# Patient Record
Sex: Male | Born: 1940
Health system: Southern US, Community
[De-identification: ages and names within clinical notes are randomized; demographics above are authoritative.]

## PROBLEM LIST (undated history)

## (undated) DIAGNOSIS — I499 Cardiac arrhythmia, unspecified: Secondary | ICD-10-CM

## (undated) DIAGNOSIS — I495 Sick sinus syndrome: Secondary | ICD-10-CM

## (undated) DIAGNOSIS — I2510 Atherosclerotic heart disease of native coronary artery without angina pectoris: Secondary | ICD-10-CM

## (undated) DIAGNOSIS — E7800 Pure hypercholesterolemia, unspecified: Secondary | ICD-10-CM

## (undated) DIAGNOSIS — I10 Essential (primary) hypertension: Secondary | ICD-10-CM

## (undated) DIAGNOSIS — G4730 Sleep apnea, unspecified: Secondary | ICD-10-CM

## (undated) DIAGNOSIS — I4891 Unspecified atrial fibrillation: Secondary | ICD-10-CM

## (undated) HISTORY — PX: ANAL FISSURE REPAIR: SHX2312

## (undated) HISTORY — PX: TONSILLECTOMY: SUR1361

## (undated) HISTORY — DX: Essential (primary) hypertension: I10

## (undated) HISTORY — DX: Unspecified atrial fibrillation: I48.91

## (undated) HISTORY — PX: CATARACT EXTRACTION: SUR2

## (undated) HISTORY — PX: AORTIC VALVE REPAIR: SHX6306

---

## 2004-06-06 ENCOUNTER — Ambulatory Visit: Payer: Self-pay

## 2005-06-07 ENCOUNTER — Ambulatory Visit: Payer: Self-pay | Admitting: Neurology

## 2007-06-15 ENCOUNTER — Ambulatory Visit: Payer: Self-pay | Admitting: Gastroenterology

## 2008-07-10 ENCOUNTER — Ambulatory Visit (HOSPITAL_BASED_OUTPATIENT_CLINIC_OR_DEPARTMENT_OTHER): Admission: RE | Admit: 2008-07-10 | Discharge: 2008-07-10 | Payer: Self-pay | Admitting: General Surgery

## 2008-07-10 ENCOUNTER — Encounter (INDEPENDENT_AMBULATORY_CARE_PROVIDER_SITE_OTHER): Payer: Self-pay | Admitting: General Surgery

## 2009-02-09 ENCOUNTER — Ambulatory Visit: Payer: Self-pay | Admitting: Internal Medicine

## 2009-06-26 ENCOUNTER — Ambulatory Visit: Payer: Self-pay | Admitting: Family Medicine

## 2009-07-22 ENCOUNTER — Ambulatory Visit: Payer: Self-pay | Admitting: Family Medicine

## 2009-08-22 ENCOUNTER — Ambulatory Visit: Payer: Self-pay | Admitting: Family Medicine

## 2010-05-28 ENCOUNTER — Ambulatory Visit: Payer: Self-pay | Admitting: Gastroenterology

## 2010-05-28 LAB — HM COLONOSCOPY

## 2010-06-03 LAB — PATHOLOGY REPORT

## 2010-07-03 LAB — CBC
HCT: 44.2 % (ref 39.0–52.0)
Hemoglobin: 15.2 g/dL (ref 13.0–17.0)
RDW: 12.8 % (ref 11.5–15.5)
WBC: 4.9 10*3/uL (ref 4.0–10.5)

## 2010-07-03 LAB — URINALYSIS, ROUTINE W REFLEX MICROSCOPIC
Glucose, UA: NEGATIVE mg/dL
Ketones, ur: NEGATIVE mg/dL
Nitrite: NEGATIVE
Specific Gravity, Urine: 1.016 (ref 1.005–1.030)
pH: 6 (ref 5.0–8.0)

## 2010-07-03 LAB — COMPREHENSIVE METABOLIC PANEL
ALT: 53 U/L (ref 0–53)
BUN: 12 mg/dL (ref 6–23)
CO2: 30 mEq/L (ref 19–32)
Calcium: 9.7 mg/dL (ref 8.4–10.5)
Creatinine, Ser: 0.89 mg/dL (ref 0.4–1.5)
GFR calc non Af Amer: >60 mL/min (ref >60)
Glucose, Bld: 94 mg/dL (ref 70–99)
Sodium: 140 mEq/L (ref 135–145)
Total Protein: 7 g/dL (ref 6.0–8.3)

## 2010-07-03 LAB — DIFFERENTIAL
Basophils Absolute: 0 10*3/uL (ref 0.0–0.1)
Lymphocytes Relative: 29 % (ref 12–46)
Lymphs Abs: 1.4 10*3/uL (ref 0.7–4.0)
Monocytes Absolute: 0.5 10*3/uL (ref 0.1–1.0)
Neutro Abs: 2.8 10*3/uL (ref 1.7–7.7)

## 2010-07-03 LAB — POCT HEMOGLOBIN-HEMACUE: Hemoglobin: 16.2 g/dL (ref 13.0–17.0)

## 2010-08-06 NOTE — Op Note (Signed)
NAME:  Jeremy Kane, Jeremy Kane              ACCOUNT NO.:  [id number]   MEDICAL RECORD NO.:  [id number]          PATIENT TYPE:  AMB   LOCATION:  DSC                          FACILITY:  [facility]   PHYSICIAN:  Angelia Mould. Derrell Lolling, M.D.DATE OF BIRTH:  1940/04/20   DATE OF PROCEDURE:  07/10/2008  DATE OF DISCHARGE:                               OPERATIVE REPORT   PREOPERATIVE DIAGNOSIS:  Chronic anal fissure.   POSTOPERATIVE DIAGNOSES:  Chronic anal fissure, distal rectal polyp, and  external hemorrhoidal skin tag.   OPERATION PERFORMED:  1. Rigid proctoscopy with removal of distal rectal polyp, right      lateral.  2. Lateral internal sphincterotomy with anal dilatation.  3. Removal of anal papilla, posterior midline.  4. Removal of external hemorrhoidal skin tag, left lateral.   SURGEON:  Angelia Mould. Derrell Lolling, MD   OPERATIVE INDICATIONS:  This is a 70 year old gentleman who has had  intermittent problems with rectal pain and low-volume rectal bleeding.  This has been getting worse lately.  It is just particularly severe  during and after a bowel movement and it hurts to sit and drive a car.  Hydrocortisone cream has been tried, but it has not been helpful.  He  had a colonoscopy in 2009 without any neoplasia being found.  He just  had polyps.  I initially evaluated him on May 22, 2008, at which time  he had very, very tiny external hemorrhoids and a sentinel pile in the  anterior midline which was not particularly tender, but I was unable to  do a digital rectal exam because of the anal sphincter spasm.  My  presumptive diagnosis was anal fissure and he was treated with Cardizem  cream and steroid cream and warm tub baths and fiber.  He returned 4  weeks later and said that it helps some, but he still has daily pain,  especially sharp pain during a bowel movement.  Again, I could not do an  internal exam in the office.  He was offered examination under  anesthesia and repair of his presumptive  anal fissure.  He was counseled  and it was very much in favor of having this done.   OPERATIVE FINDINGS:  The patient had somewhat tight anal sphincter.  He  had some small external hemorrhoidal skin tags.  He had what looked like  a chronic fissure in the left posterior position and there was a long  chronic anal papilla associated with this.  There was an external  hemorrhoidal skin tag in the left anterior position and internally  distal rectal polyp in the right lateral position.  There did not seem  to be any neoplasia.  After doing the internal sphincterotomy, we were  able to gently dilate him to where I could actually get 4 fingers  through the anal sphincter.   OPERATIVE TECHNIQUE:  Following induction of general endotracheal  anesthesia, the patient was placed in a dorsolithotomy position in rigid  stirrups.  The perianal area was prepped and draped in a sterile  fashion.  The patient was identified as correct patient and correct  procedure.  Digital rectal exam revealed a palpable polyp in the right lateral  position and a palpable polyp somewhere at the posterior or left  posterior position.  These all felt rubbery like chronic inflamed  processes.   Rigid proctoscopy was carried out.  I was able to go to 20 cm before the  prep was bad.  I saw no other mucosal disease.   I reinserted the anoscope after dilating the anal sphincters somewhat  digitally.  I was able to look around.  Most notably, it was an open  area in the left posterior position which looked like a chronic anal  fissure and actually the internal sphincter muscles were exposed at the  base of this.  There was an external hemorrhoidal skin tag in the left  lateral position.  This was very small, so I simply excised it with  electrocautery.  There was a long anal papilla in the posterior midline,  which I dissected free with electrocautery and excised with  electrocautery.  There was distal rectal polyp  in the right lateral  position, which I also excised with cautery.  All 3 of these specimens  were sent for histology.   I injected perianal tissues generously with a total of 30 mL of 0.5%  Marcaine with epinephrine.  I positioned the anoscope first on the  right, then the left.  Actually on the left, I could palpate the  intersphincteric group best and so I chose to do the lateral internal  sphincterotomy in the left lateral position.  I took an 11 blade  percutaneously and inserted it through the perianal skin between the  internal and external sphincter muscles.  I then turned the knife blade  toward the lumen and under direct vision divided the internal sphincter.  I did not divide any other mucosa.  I then removed it with knife and  held pressure on this for about 5 minutes.  I then reinserted the  anoscope.  I had bleeding from the chronic anal fissure wound in the  left lateral position.  I was able to control this with electrocautery.  This wound was probably a good centimeter and half long by about three-  quarters of a centimeter wide and appeared chronic.  I cauterized the  base of this.  I placed Surgicel and we had excellent hemostasis.  I  checked all the other areas where I excised skin tags and polyps and  they were hemostatic.  I checked for fissure in the anterior midline and  posterior midline.  I really did not see one.  I placed some more  Surgicel on the wound and observed for a few more minutes and there was  no bleeding and so I felt this was all that we need to do.  It should be  noted that by the time I completed the case I was able to slowly dilate  the anal sphincters to where I could get 4 fingers together through the  anal sphincter, so I felt that I had done an adequate anal stretch.  The  external sphincter muscles were intact.   The patient tolerated the procedure well and was taken to the recovery  room in stable condition.  Estimated blood loss was  probably about 25-30  mL.  Complications none.  Sponge and instrument counts were correct.  No  sutures were used.      Angelia Mould. Derrell Lolling, M.D.  Electronically Signed     HMI/MEDQ  D:  07/10/2008  T:  07/11/2008  Job:  [id number]   cc:   Julieanne Manson

## 2011-01-02 DIAGNOSIS — I480 Paroxysmal atrial fibrillation: Secondary | ICD-10-CM | POA: Insufficient documentation

## 2011-01-02 DIAGNOSIS — E119 Type 2 diabetes mellitus without complications: Secondary | ICD-10-CM | POA: Insufficient documentation

## 2011-01-15 ENCOUNTER — Ambulatory Visit: Payer: Self-pay | Admitting: Internal Medicine

## 2011-01-23 ENCOUNTER — Ambulatory Visit: Payer: Self-pay | Admitting: Internal Medicine

## 2011-03-25 HISTORY — PX: PACEMAKER INSERTION: SHX728

## 2011-03-25 HISTORY — PX: CARDIAC SURGERY: SHX584

## 2011-03-27 DIAGNOSIS — Z7901 Long term (current) use of anticoagulants: Secondary | ICD-10-CM | POA: Diagnosis not present

## 2011-03-27 DIAGNOSIS — Z5181 Encounter for therapeutic drug level monitoring: Secondary | ICD-10-CM | POA: Diagnosis not present

## 2011-04-03 DIAGNOSIS — I4891 Unspecified atrial fibrillation: Secondary | ICD-10-CM | POA: Diagnosis not present

## 2011-04-10 DIAGNOSIS — I4891 Unspecified atrial fibrillation: Secondary | ICD-10-CM | POA: Diagnosis not present

## 2011-04-11 DIAGNOSIS — Z01818 Encounter for other preprocedural examination: Secondary | ICD-10-CM | POA: Diagnosis not present

## 2011-04-11 DIAGNOSIS — E119 Type 2 diabetes mellitus without complications: Secondary | ICD-10-CM | POA: Diagnosis not present

## 2011-04-11 DIAGNOSIS — I379 Nonrheumatic pulmonary valve disorder, unspecified: Secondary | ICD-10-CM | POA: Diagnosis not present

## 2011-04-11 DIAGNOSIS — Z7982 Long term (current) use of aspirin: Secondary | ICD-10-CM | POA: Diagnosis not present

## 2011-04-11 DIAGNOSIS — E785 Hyperlipidemia, unspecified: Secondary | ICD-10-CM | POA: Diagnosis not present

## 2011-04-11 DIAGNOSIS — G4730 Sleep apnea, unspecified: Secondary | ICD-10-CM | POA: Diagnosis not present

## 2011-04-11 DIAGNOSIS — I4891 Unspecified atrial fibrillation: Secondary | ICD-10-CM | POA: Diagnosis not present

## 2011-04-11 DIAGNOSIS — I079 Rheumatic tricuspid valve disease, unspecified: Secondary | ICD-10-CM | POA: Diagnosis not present

## 2011-04-11 DIAGNOSIS — I10 Essential (primary) hypertension: Secondary | ICD-10-CM | POA: Diagnosis not present

## 2011-04-11 DIAGNOSIS — I517 Cardiomegaly: Secondary | ICD-10-CM | POA: Diagnosis not present

## 2011-04-11 DIAGNOSIS — I059 Rheumatic mitral valve disease, unspecified: Secondary | ICD-10-CM | POA: Diagnosis not present

## 2011-04-11 DIAGNOSIS — Z79899 Other long term (current) drug therapy: Secondary | ICD-10-CM | POA: Diagnosis not present

## 2011-04-11 DIAGNOSIS — Z7901 Long term (current) use of anticoagulants: Secondary | ICD-10-CM | POA: Diagnosis not present

## 2011-04-15 DIAGNOSIS — I4891 Unspecified atrial fibrillation: Secondary | ICD-10-CM | POA: Diagnosis not present

## 2011-04-21 DIAGNOSIS — I359 Nonrheumatic aortic valve disorder, unspecified: Secondary | ICD-10-CM | POA: Diagnosis not present

## 2011-04-21 DIAGNOSIS — Z79899 Other long term (current) drug therapy: Secondary | ICD-10-CM | POA: Diagnosis not present

## 2011-04-21 DIAGNOSIS — R0602 Shortness of breath: Secondary | ICD-10-CM | POA: Diagnosis not present

## 2011-04-21 DIAGNOSIS — I4891 Unspecified atrial fibrillation: Secondary | ICD-10-CM | POA: Diagnosis not present

## 2011-04-21 DIAGNOSIS — I517 Cardiomegaly: Secondary | ICD-10-CM | POA: Diagnosis not present

## 2011-04-21 DIAGNOSIS — I10 Essential (primary) hypertension: Secondary | ICD-10-CM | POA: Diagnosis not present

## 2011-04-21 DIAGNOSIS — I079 Rheumatic tricuspid valve disease, unspecified: Secondary | ICD-10-CM | POA: Diagnosis not present

## 2011-04-21 DIAGNOSIS — I77810 Thoracic aortic ectasia: Secondary | ICD-10-CM | POA: Diagnosis not present

## 2011-04-21 DIAGNOSIS — I209 Angina pectoris, unspecified: Secondary | ICD-10-CM | POA: Diagnosis not present

## 2011-04-21 DIAGNOSIS — E785 Hyperlipidemia, unspecified: Secondary | ICD-10-CM | POA: Diagnosis not present

## 2011-04-21 DIAGNOSIS — I712 Thoracic aortic aneurysm, without rupture: Secondary | ICD-10-CM | POA: Diagnosis not present

## 2011-04-21 DIAGNOSIS — R0789 Other chest pain: Secondary | ICD-10-CM | POA: Diagnosis not present

## 2011-04-24 DIAGNOSIS — I079 Rheumatic tricuspid valve disease, unspecified: Secondary | ICD-10-CM | POA: Diagnosis not present

## 2011-04-24 DIAGNOSIS — I4891 Unspecified atrial fibrillation: Secondary | ICD-10-CM | POA: Diagnosis not present

## 2011-05-06 DIAGNOSIS — I2510 Atherosclerotic heart disease of native coronary artery without angina pectoris: Secondary | ICD-10-CM | POA: Diagnosis not present

## 2011-05-06 DIAGNOSIS — E782 Mixed hyperlipidemia: Secondary | ICD-10-CM | POA: Diagnosis not present

## 2011-05-06 DIAGNOSIS — I4891 Unspecified atrial fibrillation: Secondary | ICD-10-CM | POA: Diagnosis not present

## 2011-05-06 DIAGNOSIS — I119 Hypertensive heart disease without heart failure: Secondary | ICD-10-CM | POA: Diagnosis not present

## 2011-05-08 DIAGNOSIS — Z01818 Encounter for other preprocedural examination: Secondary | ICD-10-CM | POA: Diagnosis not present

## 2011-05-08 DIAGNOSIS — D62 Acute posthemorrhagic anemia: Secondary | ICD-10-CM | POA: Diagnosis not present

## 2011-05-08 DIAGNOSIS — I2510 Atherosclerotic heart disease of native coronary artery without angina pectoris: Secondary | ICD-10-CM | POA: Diagnosis not present

## 2011-05-08 DIAGNOSIS — J9600 Acute respiratory failure, unspecified whether with hypoxia or hypercapnia: Secondary | ICD-10-CM | POA: Diagnosis not present

## 2011-05-08 DIAGNOSIS — I517 Cardiomegaly: Secondary | ICD-10-CM | POA: Diagnosis not present

## 2011-05-08 DIAGNOSIS — N179 Acute kidney failure, unspecified: Secondary | ICD-10-CM | POA: Diagnosis not present

## 2011-05-09 DIAGNOSIS — I712 Thoracic aortic aneurysm, without rupture, unspecified: Secondary | ICD-10-CM | POA: Diagnosis present

## 2011-05-09 DIAGNOSIS — I359 Nonrheumatic aortic valve disorder, unspecified: Secondary | ICD-10-CM | POA: Diagnosis not present

## 2011-05-09 DIAGNOSIS — G4733 Obstructive sleep apnea (adult) (pediatric): Secondary | ICD-10-CM | POA: Diagnosis present

## 2011-05-09 DIAGNOSIS — D62 Acute posthemorrhagic anemia: Secondary | ICD-10-CM | POA: Diagnosis not present

## 2011-05-09 DIAGNOSIS — I2510 Atherosclerotic heart disease of native coronary artery without angina pectoris: Secondary | ICD-10-CM | POA: Diagnosis not present

## 2011-05-09 DIAGNOSIS — D696 Thrombocytopenia, unspecified: Secondary | ICD-10-CM | POA: Diagnosis not present

## 2011-05-09 DIAGNOSIS — Z452 Encounter for adjustment and management of vascular access device: Secondary | ICD-10-CM | POA: Diagnosis not present

## 2011-05-09 DIAGNOSIS — N179 Acute kidney failure, unspecified: Secondary | ICD-10-CM | POA: Diagnosis not present

## 2011-05-09 DIAGNOSIS — I4430 Unspecified atrioventricular block: Secondary | ICD-10-CM | POA: Diagnosis not present

## 2011-05-09 DIAGNOSIS — I951 Orthostatic hypotension: Secondary | ICD-10-CM | POA: Diagnosis not present

## 2011-05-09 DIAGNOSIS — Z951 Presence of aortocoronary bypass graft: Secondary | ICD-10-CM | POA: Diagnosis not present

## 2011-05-09 DIAGNOSIS — Z4682 Encounter for fitting and adjustment of non-vascular catheter: Secondary | ICD-10-CM | POA: Diagnosis not present

## 2011-05-09 DIAGNOSIS — I442 Atrioventricular block, complete: Secondary | ICD-10-CM | POA: Diagnosis not present

## 2011-05-09 DIAGNOSIS — I079 Rheumatic tricuspid valve disease, unspecified: Secondary | ICD-10-CM | POA: Diagnosis not present

## 2011-05-09 DIAGNOSIS — I498 Other specified cardiac arrhythmias: Secondary | ICD-10-CM | POA: Diagnosis not present

## 2011-05-09 DIAGNOSIS — R918 Other nonspecific abnormal finding of lung field: Secondary | ICD-10-CM | POA: Diagnosis not present

## 2011-05-09 DIAGNOSIS — Z79899 Other long term (current) drug therapy: Secondary | ICD-10-CM | POA: Diagnosis not present

## 2011-05-09 DIAGNOSIS — Z954 Presence of other heart-valve replacement: Secondary | ICD-10-CM | POA: Diagnosis not present

## 2011-05-09 DIAGNOSIS — I10 Essential (primary) hypertension: Secondary | ICD-10-CM | POA: Diagnosis present

## 2011-05-09 DIAGNOSIS — J9819 Other pulmonary collapse: Secondary | ICD-10-CM | POA: Diagnosis not present

## 2011-05-09 DIAGNOSIS — I509 Heart failure, unspecified: Secondary | ICD-10-CM | POA: Diagnosis not present

## 2011-05-09 DIAGNOSIS — Z01818 Encounter for other preprocedural examination: Secondary | ICD-10-CM | POA: Diagnosis not present

## 2011-05-09 DIAGNOSIS — R9431 Abnormal electrocardiogram [ECG] [EKG]: Secondary | ICD-10-CM | POA: Diagnosis not present

## 2011-05-09 DIAGNOSIS — I4891 Unspecified atrial fibrillation: Secondary | ICD-10-CM | POA: Diagnosis present

## 2011-05-09 DIAGNOSIS — J90 Pleural effusion, not elsewhere classified: Secondary | ICD-10-CM | POA: Diagnosis not present

## 2011-05-09 DIAGNOSIS — J9600 Acute respiratory failure, unspecified whether with hypoxia or hypercapnia: Secondary | ICD-10-CM | POA: Diagnosis not present

## 2011-05-09 DIAGNOSIS — I517 Cardiomegaly: Secondary | ICD-10-CM | POA: Diagnosis not present

## 2011-05-09 DIAGNOSIS — I495 Sick sinus syndrome: Secondary | ICD-10-CM | POA: Diagnosis not present

## 2011-05-29 DIAGNOSIS — I079 Rheumatic tricuspid valve disease, unspecified: Secondary | ICD-10-CM | POA: Diagnosis not present

## 2011-06-03 DIAGNOSIS — I359 Nonrheumatic aortic valve disorder, unspecified: Secondary | ICD-10-CM | POA: Diagnosis not present

## 2011-06-03 DIAGNOSIS — I712 Thoracic aortic aneurysm, without rupture: Secondary | ICD-10-CM | POA: Diagnosis not present

## 2011-06-03 DIAGNOSIS — I2510 Atherosclerotic heart disease of native coronary artery without angina pectoris: Secondary | ICD-10-CM | POA: Diagnosis not present

## 2011-06-03 DIAGNOSIS — I4891 Unspecified atrial fibrillation: Secondary | ICD-10-CM | POA: Diagnosis not present

## 2011-06-25 DIAGNOSIS — R079 Chest pain, unspecified: Secondary | ICD-10-CM | POA: Diagnosis not present

## 2011-06-25 DIAGNOSIS — R5381 Other malaise: Secondary | ICD-10-CM | POA: Diagnosis not present

## 2011-06-25 DIAGNOSIS — I10 Essential (primary) hypertension: Secondary | ICD-10-CM | POA: Diagnosis not present

## 2011-06-25 DIAGNOSIS — I4891 Unspecified atrial fibrillation: Secondary | ICD-10-CM | POA: Diagnosis not present

## 2011-06-30 ENCOUNTER — Encounter: Payer: Self-pay | Admitting: Internal Medicine

## 2011-06-30 DIAGNOSIS — Z950 Presence of cardiac pacemaker: Secondary | ICD-10-CM | POA: Diagnosis not present

## 2011-06-30 DIAGNOSIS — Z954 Presence of other heart-valve replacement: Secondary | ICD-10-CM | POA: Diagnosis not present

## 2011-06-30 DIAGNOSIS — Z5189 Encounter for other specified aftercare: Secondary | ICD-10-CM | POA: Diagnosis not present

## 2011-07-02 DIAGNOSIS — I119 Hypertensive heart disease without heart failure: Secondary | ICD-10-CM | POA: Diagnosis not present

## 2011-07-02 DIAGNOSIS — I369 Nonrheumatic tricuspid valve disorder, unspecified: Secondary | ICD-10-CM | POA: Diagnosis not present

## 2011-07-02 DIAGNOSIS — I4891 Unspecified atrial fibrillation: Secondary | ICD-10-CM | POA: Diagnosis not present

## 2011-07-02 DIAGNOSIS — I2510 Atherosclerotic heart disease of native coronary artery without angina pectoris: Secondary | ICD-10-CM | POA: Diagnosis not present

## 2011-07-08 DIAGNOSIS — L821 Other seborrheic keratosis: Secondary | ICD-10-CM | POA: Diagnosis not present

## 2011-07-08 DIAGNOSIS — L570 Actinic keratosis: Secondary | ICD-10-CM | POA: Diagnosis not present

## 2011-07-08 DIAGNOSIS — Z85828 Personal history of other malignant neoplasm of skin: Secondary | ICD-10-CM | POA: Diagnosis not present

## 2011-07-09 DIAGNOSIS — Z954 Presence of other heart-valve replacement: Secondary | ICD-10-CM | POA: Diagnosis not present

## 2011-07-09 DIAGNOSIS — Z950 Presence of cardiac pacemaker: Secondary | ICD-10-CM | POA: Diagnosis not present

## 2011-07-09 DIAGNOSIS — Z5189 Encounter for other specified aftercare: Secondary | ICD-10-CM | POA: Diagnosis not present

## 2011-07-10 DIAGNOSIS — Z5189 Encounter for other specified aftercare: Secondary | ICD-10-CM | POA: Diagnosis not present

## 2011-07-10 DIAGNOSIS — Z950 Presence of cardiac pacemaker: Secondary | ICD-10-CM | POA: Diagnosis not present

## 2011-07-10 DIAGNOSIS — Z954 Presence of other heart-valve replacement: Secondary | ICD-10-CM | POA: Diagnosis not present

## 2011-07-14 DIAGNOSIS — Z950 Presence of cardiac pacemaker: Secondary | ICD-10-CM | POA: Diagnosis not present

## 2011-07-14 DIAGNOSIS — Z954 Presence of other heart-valve replacement: Secondary | ICD-10-CM | POA: Diagnosis not present

## 2011-07-14 DIAGNOSIS — Z5189 Encounter for other specified aftercare: Secondary | ICD-10-CM | POA: Diagnosis not present

## 2011-07-16 DIAGNOSIS — Z950 Presence of cardiac pacemaker: Secondary | ICD-10-CM | POA: Diagnosis not present

## 2011-07-16 DIAGNOSIS — Z5189 Encounter for other specified aftercare: Secondary | ICD-10-CM | POA: Diagnosis not present

## 2011-07-16 DIAGNOSIS — Z954 Presence of other heart-valve replacement: Secondary | ICD-10-CM | POA: Diagnosis not present

## 2011-07-16 DIAGNOSIS — L851 Acquired keratosis [keratoderma] palmaris et plantaris: Secondary | ICD-10-CM | POA: Diagnosis not present

## 2011-07-16 DIAGNOSIS — D485 Neoplasm of uncertain behavior of skin: Secondary | ICD-10-CM | POA: Diagnosis not present

## 2011-07-17 DIAGNOSIS — Z950 Presence of cardiac pacemaker: Secondary | ICD-10-CM | POA: Diagnosis not present

## 2011-07-17 DIAGNOSIS — Z5189 Encounter for other specified aftercare: Secondary | ICD-10-CM | POA: Diagnosis not present

## 2011-07-17 DIAGNOSIS — Z954 Presence of other heart-valve replacement: Secondary | ICD-10-CM | POA: Diagnosis not present

## 2011-07-21 DIAGNOSIS — Z950 Presence of cardiac pacemaker: Secondary | ICD-10-CM | POA: Diagnosis not present

## 2011-07-21 DIAGNOSIS — Z5189 Encounter for other specified aftercare: Secondary | ICD-10-CM | POA: Diagnosis not present

## 2011-07-21 DIAGNOSIS — Z954 Presence of other heart-valve replacement: Secondary | ICD-10-CM | POA: Diagnosis not present

## 2011-07-23 ENCOUNTER — Encounter: Payer: Self-pay | Admitting: Internal Medicine

## 2011-07-23 DIAGNOSIS — Z950 Presence of cardiac pacemaker: Secondary | ICD-10-CM | POA: Diagnosis not present

## 2011-07-23 DIAGNOSIS — Z5189 Encounter for other specified aftercare: Secondary | ICD-10-CM | POA: Diagnosis not present

## 2011-07-23 DIAGNOSIS — Z954 Presence of other heart-valve replacement: Secondary | ICD-10-CM | POA: Diagnosis not present

## 2011-08-01 DIAGNOSIS — M67919 Unspecified disorder of synovium and tendon, unspecified shoulder: Secondary | ICD-10-CM | POA: Diagnosis not present

## 2011-08-11 DIAGNOSIS — I351 Nonrheumatic aortic (valve) insufficiency: Secondary | ICD-10-CM | POA: Insufficient documentation

## 2011-08-11 DIAGNOSIS — Z9889 Other specified postprocedural states: Secondary | ICD-10-CM | POA: Insufficient documentation

## 2011-08-11 DIAGNOSIS — I071 Rheumatic tricuspid insufficiency: Secondary | ICD-10-CM | POA: Insufficient documentation

## 2011-08-12 DIAGNOSIS — Z79899 Other long term (current) drug therapy: Secondary | ICD-10-CM | POA: Diagnosis not present

## 2011-08-12 DIAGNOSIS — I4891 Unspecified atrial fibrillation: Secondary | ICD-10-CM | POA: Diagnosis not present

## 2011-08-12 DIAGNOSIS — Z950 Presence of cardiac pacemaker: Secondary | ICD-10-CM | POA: Diagnosis not present

## 2011-08-23 ENCOUNTER — Encounter: Payer: Self-pay | Admitting: Internal Medicine

## 2011-08-25 DIAGNOSIS — Z5189 Encounter for other specified aftercare: Secondary | ICD-10-CM | POA: Diagnosis not present

## 2011-08-25 DIAGNOSIS — Z950 Presence of cardiac pacemaker: Secondary | ICD-10-CM | POA: Diagnosis not present

## 2011-08-25 DIAGNOSIS — Z954 Presence of other heart-valve replacement: Secondary | ICD-10-CM | POA: Diagnosis not present

## 2011-08-26 DIAGNOSIS — Z79899 Other long term (current) drug therapy: Secondary | ICD-10-CM | POA: Diagnosis not present

## 2011-08-26 DIAGNOSIS — R5381 Other malaise: Secondary | ICD-10-CM | POA: Diagnosis not present

## 2011-08-26 DIAGNOSIS — D649 Anemia, unspecified: Secondary | ICD-10-CM | POA: Diagnosis not present

## 2011-08-26 DIAGNOSIS — I4891 Unspecified atrial fibrillation: Secondary | ICD-10-CM | POA: Diagnosis not present

## 2011-08-26 DIAGNOSIS — E119 Type 2 diabetes mellitus without complications: Secondary | ICD-10-CM | POA: Diagnosis not present

## 2011-08-26 DIAGNOSIS — I10 Essential (primary) hypertension: Secondary | ICD-10-CM | POA: Diagnosis not present

## 2011-08-27 DIAGNOSIS — Z954 Presence of other heart-valve replacement: Secondary | ICD-10-CM | POA: Diagnosis not present

## 2011-08-27 DIAGNOSIS — Z950 Presence of cardiac pacemaker: Secondary | ICD-10-CM | POA: Diagnosis not present

## 2011-08-27 DIAGNOSIS — M75100 Unspecified rotator cuff tear or rupture of unspecified shoulder, not specified as traumatic: Secondary | ICD-10-CM | POA: Diagnosis not present

## 2011-08-27 DIAGNOSIS — Z5189 Encounter for other specified aftercare: Secondary | ICD-10-CM | POA: Diagnosis not present

## 2011-08-28 DIAGNOSIS — E785 Hyperlipidemia, unspecified: Secondary | ICD-10-CM | POA: Diagnosis not present

## 2011-08-28 DIAGNOSIS — I10 Essential (primary) hypertension: Secondary | ICD-10-CM | POA: Diagnosis not present

## 2011-08-28 DIAGNOSIS — D649 Anemia, unspecified: Secondary | ICD-10-CM | POA: Diagnosis not present

## 2011-08-28 DIAGNOSIS — R5381 Other malaise: Secondary | ICD-10-CM | POA: Diagnosis not present

## 2011-08-28 DIAGNOSIS — E119 Type 2 diabetes mellitus without complications: Secondary | ICD-10-CM | POA: Diagnosis not present

## 2011-09-03 DIAGNOSIS — I359 Nonrheumatic aortic valve disorder, unspecified: Secondary | ICD-10-CM | POA: Diagnosis not present

## 2011-09-03 DIAGNOSIS — I119 Hypertensive heart disease without heart failure: Secondary | ICD-10-CM | POA: Diagnosis not present

## 2011-09-03 DIAGNOSIS — I4891 Unspecified atrial fibrillation: Secondary | ICD-10-CM | POA: Diagnosis not present

## 2011-09-03 DIAGNOSIS — I2510 Atherosclerotic heart disease of native coronary artery without angina pectoris: Secondary | ICD-10-CM | POA: Diagnosis not present

## 2011-09-08 DIAGNOSIS — Z950 Presence of cardiac pacemaker: Secondary | ICD-10-CM | POA: Diagnosis not present

## 2011-09-08 DIAGNOSIS — Z954 Presence of other heart-valve replacement: Secondary | ICD-10-CM | POA: Diagnosis not present

## 2011-09-08 DIAGNOSIS — Z5189 Encounter for other specified aftercare: Secondary | ICD-10-CM | POA: Diagnosis not present

## 2011-09-10 DIAGNOSIS — Z954 Presence of other heart-valve replacement: Secondary | ICD-10-CM | POA: Diagnosis not present

## 2011-09-10 DIAGNOSIS — Z5189 Encounter for other specified aftercare: Secondary | ICD-10-CM | POA: Diagnosis not present

## 2011-09-10 DIAGNOSIS — M75100 Unspecified rotator cuff tear or rupture of unspecified shoulder, not specified as traumatic: Secondary | ICD-10-CM | POA: Diagnosis not present

## 2011-09-10 DIAGNOSIS — Z950 Presence of cardiac pacemaker: Secondary | ICD-10-CM | POA: Diagnosis not present

## 2011-09-11 DIAGNOSIS — Z954 Presence of other heart-valve replacement: Secondary | ICD-10-CM | POA: Diagnosis not present

## 2011-09-11 DIAGNOSIS — Z950 Presence of cardiac pacemaker: Secondary | ICD-10-CM | POA: Diagnosis not present

## 2011-09-11 DIAGNOSIS — Z5189 Encounter for other specified aftercare: Secondary | ICD-10-CM | POA: Diagnosis not present

## 2011-09-15 DIAGNOSIS — M75100 Unspecified rotator cuff tear or rupture of unspecified shoulder, not specified as traumatic: Secondary | ICD-10-CM | POA: Diagnosis not present

## 2011-10-23 DIAGNOSIS — M75100 Unspecified rotator cuff tear or rupture of unspecified shoulder, not specified as traumatic: Secondary | ICD-10-CM | POA: Diagnosis not present

## 2011-11-25 DIAGNOSIS — M67919 Unspecified disorder of synovium and tendon, unspecified shoulder: Secondary | ICD-10-CM | POA: Diagnosis not present

## 2011-11-25 DIAGNOSIS — M719 Bursopathy, unspecified: Secondary | ICD-10-CM | POA: Diagnosis not present

## 2011-12-09 DIAGNOSIS — E7800 Pure hypercholesterolemia, unspecified: Secondary | ICD-10-CM | POA: Diagnosis not present

## 2011-12-09 DIAGNOSIS — Z45018 Encounter for adjustment and management of other part of cardiac pacemaker: Secondary | ICD-10-CM | POA: Diagnosis not present

## 2011-12-09 DIAGNOSIS — Z950 Presence of cardiac pacemaker: Secondary | ICD-10-CM | POA: Diagnosis not present

## 2011-12-09 DIAGNOSIS — Z79899 Other long term (current) drug therapy: Secondary | ICD-10-CM | POA: Diagnosis not present

## 2011-12-09 DIAGNOSIS — I4891 Unspecified atrial fibrillation: Secondary | ICD-10-CM | POA: Diagnosis not present

## 2011-12-11 DIAGNOSIS — Z23 Encounter for immunization: Secondary | ICD-10-CM | POA: Diagnosis not present

## 2011-12-18 DIAGNOSIS — I4891 Unspecified atrial fibrillation: Secondary | ICD-10-CM | POA: Diagnosis not present

## 2011-12-18 DIAGNOSIS — I2510 Atherosclerotic heart disease of native coronary artery without angina pectoris: Secondary | ICD-10-CM | POA: Diagnosis not present

## 2011-12-18 DIAGNOSIS — I119 Hypertensive heart disease without heart failure: Secondary | ICD-10-CM | POA: Diagnosis not present

## 2011-12-30 DIAGNOSIS — M719 Bursopathy, unspecified: Secondary | ICD-10-CM | POA: Diagnosis not present

## 2011-12-30 DIAGNOSIS — M67919 Unspecified disorder of synovium and tendon, unspecified shoulder: Secondary | ICD-10-CM | POA: Diagnosis not present

## 2012-01-27 DIAGNOSIS — Z125 Encounter for screening for malignant neoplasm of prostate: Secondary | ICD-10-CM | POA: Diagnosis not present

## 2012-01-27 DIAGNOSIS — Z85828 Personal history of other malignant neoplasm of skin: Secondary | ICD-10-CM | POA: Diagnosis not present

## 2012-01-27 DIAGNOSIS — L578 Other skin changes due to chronic exposure to nonionizing radiation: Secondary | ICD-10-CM | POA: Diagnosis not present

## 2012-01-27 DIAGNOSIS — D1800 Hemangioma unspecified site: Secondary | ICD-10-CM | POA: Diagnosis not present

## 2012-01-27 DIAGNOSIS — L570 Actinic keratosis: Secondary | ICD-10-CM | POA: Diagnosis not present

## 2012-01-27 DIAGNOSIS — I8310 Varicose veins of unspecified lower extremity with inflammation: Secondary | ICD-10-CM | POA: Diagnosis not present

## 2012-01-27 DIAGNOSIS — R5383 Other fatigue: Secondary | ICD-10-CM | POA: Diagnosis not present

## 2012-01-27 DIAGNOSIS — I10 Essential (primary) hypertension: Secondary | ICD-10-CM | POA: Diagnosis not present

## 2012-01-27 DIAGNOSIS — Z0000 Encounter for general adult medical examination without abnormal findings: Secondary | ICD-10-CM | POA: Diagnosis not present

## 2012-01-27 DIAGNOSIS — L821 Other seborrheic keratosis: Secondary | ICD-10-CM | POA: Diagnosis not present

## 2012-01-27 DIAGNOSIS — E119 Type 2 diabetes mellitus without complications: Secondary | ICD-10-CM | POA: Diagnosis not present

## 2012-02-03 DIAGNOSIS — I4891 Unspecified atrial fibrillation: Secondary | ICD-10-CM | POA: Diagnosis not present

## 2012-02-03 DIAGNOSIS — R911 Solitary pulmonary nodule: Secondary | ICD-10-CM | POA: Diagnosis not present

## 2012-02-03 DIAGNOSIS — Z8679 Personal history of other diseases of the circulatory system: Secondary | ICD-10-CM | POA: Diagnosis not present

## 2012-02-03 DIAGNOSIS — Z9889 Other specified postprocedural states: Secondary | ICD-10-CM | POA: Diagnosis not present

## 2012-02-03 DIAGNOSIS — I059 Rheumatic mitral valve disease, unspecified: Secondary | ICD-10-CM | POA: Diagnosis not present

## 2012-02-03 DIAGNOSIS — Z79899 Other long term (current) drug therapy: Secondary | ICD-10-CM | POA: Diagnosis not present

## 2012-02-03 DIAGNOSIS — I77810 Thoracic aortic ectasia: Secondary | ICD-10-CM | POA: Diagnosis not present

## 2012-02-04 DIAGNOSIS — Z79899 Other long term (current) drug therapy: Secondary | ICD-10-CM | POA: Diagnosis not present

## 2012-02-04 DIAGNOSIS — I4891 Unspecified atrial fibrillation: Secondary | ICD-10-CM | POA: Diagnosis not present

## 2012-02-05 DIAGNOSIS — I4891 Unspecified atrial fibrillation: Secondary | ICD-10-CM | POA: Diagnosis not present

## 2012-02-05 DIAGNOSIS — Z79899 Other long term (current) drug therapy: Secondary | ICD-10-CM | POA: Diagnosis not present

## 2012-02-25 DIAGNOSIS — L259 Unspecified contact dermatitis, unspecified cause: Secondary | ICD-10-CM | POA: Diagnosis not present

## 2012-03-02 DIAGNOSIS — I10 Essential (primary) hypertension: Secondary | ICD-10-CM | POA: Diagnosis not present

## 2012-03-02 DIAGNOSIS — I4891 Unspecified atrial fibrillation: Secondary | ICD-10-CM | POA: Diagnosis not present

## 2012-03-02 DIAGNOSIS — I2510 Atherosclerotic heart disease of native coronary artery without angina pectoris: Secondary | ICD-10-CM | POA: Diagnosis not present

## 2012-03-02 DIAGNOSIS — I369 Nonrheumatic tricuspid valve disorder, unspecified: Secondary | ICD-10-CM | POA: Diagnosis not present

## 2012-03-03 DIAGNOSIS — H6120 Impacted cerumen, unspecified ear: Secondary | ICD-10-CM | POA: Diagnosis not present

## 2012-03-24 HISTORY — PX: HERNIA REPAIR: SHX51

## 2012-03-30 DIAGNOSIS — Z950 Presence of cardiac pacemaker: Secondary | ICD-10-CM | POA: Diagnosis not present

## 2012-03-30 DIAGNOSIS — I4891 Unspecified atrial fibrillation: Secondary | ICD-10-CM | POA: Diagnosis not present

## 2012-04-16 DIAGNOSIS — E119 Type 2 diabetes mellitus without complications: Secondary | ICD-10-CM | POA: Diagnosis not present

## 2012-04-16 DIAGNOSIS — I10 Essential (primary) hypertension: Secondary | ICD-10-CM | POA: Diagnosis not present

## 2012-04-16 DIAGNOSIS — E7800 Pure hypercholesterolemia, unspecified: Secondary | ICD-10-CM | POA: Diagnosis not present

## 2012-04-16 DIAGNOSIS — I2510 Atherosclerotic heart disease of native coronary artery without angina pectoris: Secondary | ICD-10-CM | POA: Diagnosis not present

## 2012-06-21 DIAGNOSIS — I4891 Unspecified atrial fibrillation: Secondary | ICD-10-CM | POA: Diagnosis not present

## 2012-06-21 DIAGNOSIS — I2510 Atherosclerotic heart disease of native coronary artery without angina pectoris: Secondary | ICD-10-CM | POA: Diagnosis not present

## 2012-06-21 DIAGNOSIS — I119 Hypertensive heart disease without heart failure: Secondary | ICD-10-CM | POA: Diagnosis not present

## 2012-06-21 DIAGNOSIS — I369 Nonrheumatic tricuspid valve disorder, unspecified: Secondary | ICD-10-CM | POA: Diagnosis not present

## 2012-07-13 DIAGNOSIS — R9431 Abnormal electrocardiogram [ECG] [EKG]: Secondary | ICD-10-CM | POA: Diagnosis not present

## 2012-07-13 DIAGNOSIS — I4891 Unspecified atrial fibrillation: Secondary | ICD-10-CM | POA: Diagnosis not present

## 2012-07-13 DIAGNOSIS — Z45018 Encounter for adjustment and management of other part of cardiac pacemaker: Secondary | ICD-10-CM | POA: Diagnosis not present

## 2012-07-13 DIAGNOSIS — Z7901 Long term (current) use of anticoagulants: Secondary | ICD-10-CM | POA: Diagnosis not present

## 2012-07-13 DIAGNOSIS — Z950 Presence of cardiac pacemaker: Secondary | ICD-10-CM | POA: Diagnosis not present

## 2012-07-26 DIAGNOSIS — L821 Other seborrheic keratosis: Secondary | ICD-10-CM | POA: Diagnosis not present

## 2012-07-26 DIAGNOSIS — L820 Inflamed seborrheic keratosis: Secondary | ICD-10-CM | POA: Diagnosis not present

## 2012-07-26 DIAGNOSIS — L578 Other skin changes due to chronic exposure to nonionizing radiation: Secondary | ICD-10-CM | POA: Diagnosis not present

## 2012-07-26 DIAGNOSIS — Z85828 Personal history of other malignant neoplasm of skin: Secondary | ICD-10-CM | POA: Diagnosis not present

## 2012-07-26 DIAGNOSIS — D239 Other benign neoplasm of skin, unspecified: Secondary | ICD-10-CM | POA: Diagnosis not present

## 2012-08-12 DIAGNOSIS — I4891 Unspecified atrial fibrillation: Secondary | ICD-10-CM | POA: Diagnosis not present

## 2012-08-12 DIAGNOSIS — I10 Essential (primary) hypertension: Secondary | ICD-10-CM | POA: Diagnosis not present

## 2012-08-12 DIAGNOSIS — E119 Type 2 diabetes mellitus without complications: Secondary | ICD-10-CM | POA: Diagnosis not present

## 2012-08-12 DIAGNOSIS — I2510 Atherosclerotic heart disease of native coronary artery without angina pectoris: Secondary | ICD-10-CM | POA: Diagnosis not present

## 2012-08-13 DIAGNOSIS — E119 Type 2 diabetes mellitus without complications: Secondary | ICD-10-CM | POA: Diagnosis not present

## 2012-08-13 DIAGNOSIS — E785 Hyperlipidemia, unspecified: Secondary | ICD-10-CM | POA: Diagnosis not present

## 2012-08-23 DIAGNOSIS — H40009 Preglaucoma, unspecified, unspecified eye: Secondary | ICD-10-CM | POA: Diagnosis not present

## 2012-09-10 DIAGNOSIS — E119 Type 2 diabetes mellitus without complications: Secondary | ICD-10-CM | POA: Diagnosis not present

## 2012-09-10 DIAGNOSIS — K429 Umbilical hernia without obstruction or gangrene: Secondary | ICD-10-CM | POA: Diagnosis not present

## 2012-09-10 DIAGNOSIS — I10 Essential (primary) hypertension: Secondary | ICD-10-CM | POA: Diagnosis not present

## 2012-09-10 DIAGNOSIS — I4891 Unspecified atrial fibrillation: Secondary | ICD-10-CM | POA: Diagnosis not present

## 2012-09-29 DIAGNOSIS — Z7189 Other specified counseling: Secondary | ICD-10-CM | POA: Diagnosis not present

## 2012-09-29 DIAGNOSIS — Z23 Encounter for immunization: Secondary | ICD-10-CM | POA: Diagnosis not present

## 2012-10-04 ENCOUNTER — Ambulatory Visit (INDEPENDENT_AMBULATORY_CARE_PROVIDER_SITE_OTHER): Payer: Medicare Other | Admitting: General Surgery

## 2012-10-04 ENCOUNTER — Encounter: Payer: Self-pay | Admitting: General Surgery

## 2012-10-04 VITALS — BP 130/74 | HR 68 | Resp 12 | Ht 74.0 in | Wt 227.0 lb

## 2012-10-04 DIAGNOSIS — K439 Ventral hernia without obstruction or gangrene: Secondary | ICD-10-CM | POA: Diagnosis not present

## 2012-10-04 DIAGNOSIS — I4891 Unspecified atrial fibrillation: Secondary | ICD-10-CM | POA: Diagnosis not present

## 2012-10-04 NOTE — Patient Instructions (Addendum)
Laparoscopic Ventral Hernia Repair Laparoscopic ventral hernia repairis a surgery to fix a ventral hernia. Aventral hernia, also called an incisional hernia, is a bulge of body tissue or intestines that pushes through the front part of the abdomen. This can happen if the connective tissue covering the muscles over the abdomen has a weak spot or is torn because of a surgical cut (incision) from a previous surgery. Laparoscopic ventral hernia repair is often done soon after diagnosis to stop the hernia from getting bigger, becoming uncomfortable, or becoming an emergency. This surgery usually takes about 2 hours, but the time can vary greatly. LET YOUR CAREGIVER KNOW ABOUT:  Any allergies you have.  All medicines you are taking, including steroids, vitamins, herbs, eyedrops, and over-the-counter medicines and creams.  Previous problems you or members of your family have had with the use of anesthetics.  Any blood disorders or bleeding problems you have had.  Past surgeries you have had.  Other health problems you have. RISKS AND COMPLICATIONS  Generally, laparoscopic ventral hernia repair is a safe procedure. However, as with any surgical procedure, complications can occur. Possible complications include:  Bleeding.  Trouble passing urine or having a bowel movement after the operation.  Infection.  Pneumonia.  Blood clots.  Pain in the area of the hernia.  A bulge in the area of the hernia that may be caused by a collection of fluid.  Injury to intestines or other structures in the abdomen.  Return of the hernia after surgery. In some cases, the caregiver may need to stop the laparoscopic procedure and do regular, open surgery. This may be necessary for very difficult hernias, when organs are hard to see, or when bleeding problems occur during surgery. BEFORE THE PROCEDURE   You may need to have blood tests, urine tests, a chest X-ray, or electrocardiography done before the day  of the surgery.  Ask your caregiver about changing or stopping your regular medicines.  You may need to wash with a special type of germ-killing soap.  Do not eat or drink anything for at least 6 hours before the surgery.  Make plans to have someone drive you home after the procedure. PROCEDURE   Small monitors will be put on your body. They are used to check your heart, blood pressure, and oxygen level.  An intravenous (IV) access tube will be put into a vein in your hand or arm. Fluids and medicine will flow directly into your body through the IV tube.  You will be given medicine to make you sleep through the procedure (general anesthetic).  Your abdomen will be cleaned with a special soap to kill any germs on your skin.  Once you are asleep, several small incisions will be made in your abdomen.  The large space in your abdomen will be filled with air so that it expands. This gives the caregiver more room and a better view.  A thin, lighted tube with a tiny camera on the end (laparoscope) is put through a small incision in your abdomen. The camera on the laparoscope sends a picture to a TV screen in the operating room. This gives the caregiver a good view inside the abdomen.  Hollow tubes are put through the other small incisions in your abdomen. The tools needed for the procedure are put through these tubes.  The caregiver puts the tissue or intestines that formed the hernia back in place.  A screen-like patch (mesh) is used to close the hernia. This helps make   the area stronger. Stitches, tacks, or staples are used to keep the mesh in place.  Medicine and a bandage (dressing) or skin glue will be put over the incisions. AFTER THE PROCEDURE   You will stay in a recovery area until the anesthetic wears off. Your blood pressure and pulse will be checked often.  You may be able to go home the same day or may need to stay in the hospital for 1 or 2 days after this surgery. Your  caregiver will decide when you can go home.  You may feel some pain. You will likely be given medicine for pain.  You will be urged to do breathing exercises that involve taking deep breaths. This helps prevent a lung infection after a surgery.  You may have to wear compression stockings while you are in the hospital. These stockings help keep blood clots from forming in your legs. Document Released: 02/25/2012 Document Reviewed: 12/31/2011 [facility] Patient Information 2014 [place], [place].  Patient's surgery has been scheduled for 10-28-12 at [facility]. This patient has been instructed to discontinue Pradaxa for two days prior to procedure.

## 2012-10-04 NOTE — Progress Notes (Signed)
Patient ID: Jeremy Kane, male   DOB: [birth year], 72 y.o.   MRN: [id number]  Chief Complaint  Patient presents with  . Umbilical Hernia    HPI Jeremy Kane is a 72 y.o. male  Here for umbilical hernia, He has had this for about 3-4 months. Patient reports no pain or discomfort from this. His activity is not limited by this.  HPI  Past Medical History  Diagnosis Date  . A-fib   . Atrial fibrillation 10/05/2012    Past Surgical History  Procedure Laterality Date  . Cardiac surgery  2013  . Pacemaker insertion  2013    No family history on file.  Social History History  Substance Use Topics  . Smoking status: Former Smoker -- 1.00 packs/day for 19 years  . Smokeless tobacco: Never Used  . Alcohol Use: Yes    Allergies  Allergen Reactions  . Sulfa Antibiotics   . Coumadin (Warfarin Sodium) Rash    Current Outpatient Prescriptions  Medication Sig Dispense Refill  . dabigatran (PRADAXA) 150 MG CAPS Take 150 mg by mouth 2 (two) times daily.      Marland Kitchen latanoprost (XALATAN) 0.005 % ophthalmic solution Place 1 drop into both eyes daily.      . metoprolol succinate (TOPROL-XL) 25 MG 24 hr tablet Take 25 mg by mouth daily.      . psyllium (REGULOID) 0.52 G capsule Take 0.52 g by mouth daily.      . Magnesium 400 MG CAPS Take 1 capsule by mouth 2 (two) times daily.       No current facility-administered medications for this visit.    Review of Systems Review of Systems  Constitutional: Negative.   Respiratory: Negative.   Cardiovascular: Negative.     Blood pressure 130/74, pulse 68, resp. rate 12, height 6\' 2"  (1.88 m), weight 227 lb (102.967 kg).  Physical Exam Physical Exam  Constitutional: He appears well-nourished.  Cardiovascular: Normal rate, regular rhythm and normal heart sounds.   Pulmonary/Chest: Effort normal and breath sounds normal.  Abdominal: Soft. A hernia is present. Hernia confirmed positive in the ventral area (small 1.5 cm reducable hernia).     Data Reviewed Primary care notes.  Assessment    Umbilical hernia    Plan    The patient is very active, and works out at [employer] frequently. He desires to have the area repaired. This will necessitate holding his pradaxa for 2 days. By his report, porcine valves were placed and this should not require bridging therapy. This will be confirmed with cardiologist.     Patient's surgery has been scheduled for 10-28-12 at [facility]. This patient has been instructed to discontinue Pradaxa for two days prior to procedure. Patient reports he has an upcoming trip planned to [place] and wishes to have surgery after this trip and before he goes to [place] in September.  Earline Mayotte 10/05/2012, 9:39 PM

## 2012-10-05 ENCOUNTER — Encounter: Payer: Self-pay | Admitting: General Surgery

## 2012-10-05 ENCOUNTER — Other Ambulatory Visit: Payer: Self-pay

## 2012-10-05 ENCOUNTER — Other Ambulatory Visit: Payer: Self-pay | Admitting: General Surgery

## 2012-10-05 DIAGNOSIS — I4891 Unspecified atrial fibrillation: Secondary | ICD-10-CM

## 2012-10-05 DIAGNOSIS — K439 Ventral hernia without obstruction or gangrene: Secondary | ICD-10-CM

## 2012-10-05 HISTORY — DX: Unspecified atrial fibrillation: I48.91

## 2012-10-12 DIAGNOSIS — Z950 Presence of cardiac pacemaker: Secondary | ICD-10-CM | POA: Diagnosis not present

## 2012-10-13 ENCOUNTER — Ambulatory Visit: Payer: Self-pay | Admitting: General Surgery

## 2012-10-13 DIAGNOSIS — K439 Ventral hernia without obstruction or gangrene: Secondary | ICD-10-CM | POA: Diagnosis not present

## 2012-10-13 DIAGNOSIS — Z01810 Encounter for preprocedural cardiovascular examination: Secondary | ICD-10-CM | POA: Diagnosis not present

## 2012-10-13 DIAGNOSIS — I4891 Unspecified atrial fibrillation: Secondary | ICD-10-CM | POA: Diagnosis not present

## 2012-10-13 DIAGNOSIS — Z01812 Encounter for preprocedural laboratory examination: Secondary | ICD-10-CM | POA: Diagnosis not present

## 2012-10-13 LAB — CBC WITH DIFFERENTIAL/PLATELET
Basophil #: 0 10*3/uL (ref 0.0–0.1)
Basophil %: 0.7 %
HGB: 14.9 g/dL (ref 13.0–18.0)
Lymphocyte %: 35.7 %
MCHC: 34 g/dL (ref 32.0–36.0)
Neutrophil %: 48.5 %
Platelet: 91 10*3/uL — ABNORMAL LOW (ref 150–440)
RBC: 4.37 10*6/uL — ABNORMAL LOW (ref 4.40–5.90)
RDW: 13.6 % (ref 11.5–14.5)
WBC: 4 10*3/uL (ref 3.8–10.6)

## 2012-10-13 LAB — BASIC METABOLIC PANEL
Anion Gap: 5 — ABNORMAL LOW (ref 7–16)
BUN: 18 mg/dL (ref 7–18)
Chloride: 110 mmol/L — ABNORMAL HIGH (ref 98–107)
Co2: 28 mmol/L (ref 21–32)
Creatinine: 0.96 mg/dL (ref 0.60–1.30)
EGFR (African American): >60
EGFR (Non-African Amer.): >60
Osmolality: 287 (ref 275–301)

## 2012-10-25 DIAGNOSIS — D696 Thrombocytopenia, unspecified: Secondary | ICD-10-CM | POA: Diagnosis not present

## 2012-10-27 ENCOUNTER — Other Ambulatory Visit: Payer: Self-pay

## 2012-10-28 ENCOUNTER — Ambulatory Visit: Payer: Self-pay | Admitting: General Surgery

## 2012-10-28 DIAGNOSIS — E119 Type 2 diabetes mellitus without complications: Secondary | ICD-10-CM | POA: Diagnosis not present

## 2012-10-28 DIAGNOSIS — Z87891 Personal history of nicotine dependence: Secondary | ICD-10-CM | POA: Diagnosis not present

## 2012-10-28 DIAGNOSIS — Z888 Allergy status to other drugs, medicaments and biological substances status: Secondary | ICD-10-CM | POA: Diagnosis not present

## 2012-10-28 DIAGNOSIS — I4891 Unspecified atrial fibrillation: Secondary | ICD-10-CM | POA: Diagnosis not present

## 2012-10-28 DIAGNOSIS — Z950 Presence of cardiac pacemaker: Secondary | ICD-10-CM | POA: Diagnosis not present

## 2012-10-28 DIAGNOSIS — D649 Anemia, unspecified: Secondary | ICD-10-CM | POA: Diagnosis not present

## 2012-10-28 DIAGNOSIS — K429 Umbilical hernia without obstruction or gangrene: Secondary | ICD-10-CM

## 2012-10-28 DIAGNOSIS — Z79899 Other long term (current) drug therapy: Secondary | ICD-10-CM | POA: Diagnosis not present

## 2012-10-28 DIAGNOSIS — R0609 Other forms of dyspnea: Secondary | ICD-10-CM | POA: Diagnosis not present

## 2012-10-28 DIAGNOSIS — Z882 Allergy status to sulfonamides status: Secondary | ICD-10-CM | POA: Diagnosis not present

## 2012-10-28 DIAGNOSIS — G4730 Sleep apnea, unspecified: Secondary | ICD-10-CM | POA: Diagnosis not present

## 2012-10-29 ENCOUNTER — Encounter: Payer: Self-pay | Admitting: General Surgery

## 2012-11-03 ENCOUNTER — Encounter: Payer: Self-pay | Admitting: General Surgery

## 2012-11-03 ENCOUNTER — Ambulatory Visit (INDEPENDENT_AMBULATORY_CARE_PROVIDER_SITE_OTHER): Payer: Medicare Other | Admitting: General Surgery

## 2012-11-03 VITALS — BP 130/68 | HR 70 | Resp 14 | Ht 74.25 in | Wt 222.0 lb

## 2012-11-03 DIAGNOSIS — E119 Type 2 diabetes mellitus without complications: Secondary | ICD-10-CM | POA: Diagnosis not present

## 2012-11-03 DIAGNOSIS — J019 Acute sinusitis, unspecified: Secondary | ICD-10-CM | POA: Diagnosis not present

## 2012-11-03 DIAGNOSIS — I10 Essential (primary) hypertension: Secondary | ICD-10-CM | POA: Diagnosis not present

## 2012-11-03 DIAGNOSIS — I4891 Unspecified atrial fibrillation: Secondary | ICD-10-CM | POA: Diagnosis not present

## 2012-11-03 DIAGNOSIS — K429 Umbilical hernia without obstruction or gangrene: Secondary | ICD-10-CM

## 2012-11-03 NOTE — Progress Notes (Signed)
Patient ID: Jeremy Kane, male   DOB: [birth year], 72 y.o.   MRN: [id number]  Chief Complaint  Patient presents with  . Routine Post Op    7-10 day post op umbilical hernia repair    HPI Jeremy Kane is a 72 y.o. male who presents for a post op umbilical hernia repair. The procedure was done on 10/28/12. The patient denies any problems at this time. Overall doing well.  HPI  Past Medical History  Diagnosis Date  . A-fib   . Atrial fibrillation 10/05/2012    Past Surgical History  Procedure Laterality Date  . Cardiac surgery  2013  . Pacemaker insertion  2013  . Hernia repair  2014    umbilical    No family history on file.  Social History History  Substance Use Topics  . Smoking status: Former Smoker -- 1.00 packs/day for 19 years  . Smokeless tobacco: Never Used  . Alcohol Use: Yes    Allergies  Allergen Reactions  . Sulfa Antibiotics   . Coumadin [Warfarin Sodium] Rash    Current Outpatient Prescriptions  Medication Sig Dispense Refill  . dabigatran (PRADAXA) 150 MG CAPS Take 150 mg by mouth 2 (two) times daily.      Marland Kitchen latanoprost (XALATAN) 0.005 % ophthalmic solution Place 1 drop into both eyes daily.      . Magnesium 400 MG CAPS Take 1 capsule by mouth 2 (two) times daily.      . metoprolol succinate (TOPROL-XL) 25 MG 24 hr tablet Take 25 mg by mouth daily.      . psyllium (REGULOID) 0.52 G capsule Take 0.52 g by mouth daily.       No current facility-administered medications for this visit.    Review of Systems Review of Systems  Constitutional: Negative.   Respiratory: Negative.   Cardiovascular: Negative.   Gastrointestinal: Negative.     Blood pressure 130/68, pulse 70, resp. rate 14, height 6' 2.25" (1.886 m), weight 222 lb (100.699 kg).  Physical Exam Physical Exam  Constitutional: He is oriented to person, place, and time. He appears well-developed and well-nourished.  Cardiovascular: Normal rate, regular rhythm and normal heart sounds.    No murmur heard. Pulmonary/Chest: Effort normal. He has rhonchi in the right lower field.  Abdominal: Soft. Bowel sounds are normal. There is no tenderness.  Well healing abdominal incision site.   Neurological: He is alert and oriented to person, place, and time.  Skin: Skin is warm and dry.    Data Reviewed Preoperative platelet count was noted to be below 140,000.  Assessment    Doing well status post umbilical hernia repair     Plan    Good judgment was strenuous activity was encouraged.   Care with lifting was discussed. He may resume his golf game is comfortable.  Followup will be on an as-needed basis.        Earline Mayotte 11/03/2012, 10:16 PM

## 2012-11-03 NOTE — Patient Instructions (Signed)
Patient to return as needed. 

## 2012-11-09 ENCOUNTER — Ambulatory Visit: Payer: Self-pay | Admitting: Family Medicine

## 2012-11-09 DIAGNOSIS — I2510 Atherosclerotic heart disease of native coronary artery without angina pectoris: Secondary | ICD-10-CM | POA: Diagnosis not present

## 2012-11-09 DIAGNOSIS — R5381 Other malaise: Secondary | ICD-10-CM | POA: Diagnosis not present

## 2012-11-09 DIAGNOSIS — R05 Cough: Secondary | ICD-10-CM | POA: Diagnosis not present

## 2012-11-09 DIAGNOSIS — R1310 Dysphagia, unspecified: Secondary | ICD-10-CM | POA: Diagnosis not present

## 2012-11-09 DIAGNOSIS — R0789 Other chest pain: Secondary | ICD-10-CM | POA: Diagnosis not present

## 2012-11-09 DIAGNOSIS — J449 Chronic obstructive pulmonary disease, unspecified: Secondary | ICD-10-CM | POA: Diagnosis not present

## 2012-11-09 DIAGNOSIS — R059 Cough, unspecified: Secondary | ICD-10-CM | POA: Diagnosis not present

## 2012-11-09 DIAGNOSIS — J189 Pneumonia, unspecified organism: Secondary | ICD-10-CM | POA: Diagnosis not present

## 2012-11-15 DIAGNOSIS — H4010X0 Unspecified open-angle glaucoma, stage unspecified: Secondary | ICD-10-CM | POA: Diagnosis not present

## 2012-11-16 DIAGNOSIS — Z87891 Personal history of nicotine dependence: Secondary | ICD-10-CM | POA: Diagnosis not present

## 2012-11-16 DIAGNOSIS — Z7901 Long term (current) use of anticoagulants: Secondary | ICD-10-CM | POA: Diagnosis not present

## 2012-11-16 DIAGNOSIS — I10 Essential (primary) hypertension: Secondary | ICD-10-CM | POA: Diagnosis present

## 2012-11-16 DIAGNOSIS — Z950 Presence of cardiac pacemaker: Secondary | ICD-10-CM | POA: Diagnosis not present

## 2012-11-16 DIAGNOSIS — E785 Hyperlipidemia, unspecified: Secondary | ICD-10-CM | POA: Diagnosis present

## 2012-11-16 DIAGNOSIS — I4891 Unspecified atrial fibrillation: Secondary | ICD-10-CM | POA: Diagnosis not present

## 2012-11-16 DIAGNOSIS — Z954 Presence of other heart-valve replacement: Secondary | ICD-10-CM | POA: Diagnosis not present

## 2012-11-16 DIAGNOSIS — R7309 Other abnormal glucose: Secondary | ICD-10-CM | POA: Diagnosis present

## 2012-11-25 DIAGNOSIS — Z23 Encounter for immunization: Secondary | ICD-10-CM | POA: Diagnosis not present

## 2013-01-18 DIAGNOSIS — Z7901 Long term (current) use of anticoagulants: Secondary | ICD-10-CM | POA: Diagnosis not present

## 2013-01-18 DIAGNOSIS — Z5181 Encounter for therapeutic drug level monitoring: Secondary | ICD-10-CM | POA: Diagnosis not present

## 2013-01-18 DIAGNOSIS — R9431 Abnormal electrocardiogram [ECG] [EKG]: Secondary | ICD-10-CM | POA: Diagnosis not present

## 2013-01-18 DIAGNOSIS — I4891 Unspecified atrial fibrillation: Secondary | ICD-10-CM | POA: Diagnosis not present

## 2013-01-18 DIAGNOSIS — Z79899 Other long term (current) drug therapy: Secondary | ICD-10-CM | POA: Diagnosis not present

## 2013-01-18 DIAGNOSIS — Z45018 Encounter for adjustment and management of other part of cardiac pacemaker: Secondary | ICD-10-CM | POA: Diagnosis not present

## 2013-01-18 DIAGNOSIS — Z950 Presence of cardiac pacemaker: Secondary | ICD-10-CM | POA: Diagnosis not present

## 2013-01-27 ENCOUNTER — Other Ambulatory Visit: Payer: Self-pay

## 2013-02-08 DIAGNOSIS — L259 Unspecified contact dermatitis, unspecified cause: Secondary | ICD-10-CM | POA: Diagnosis not present

## 2013-02-08 DIAGNOSIS — R5381 Other malaise: Secondary | ICD-10-CM | POA: Diagnosis not present

## 2013-02-08 DIAGNOSIS — L578 Other skin changes due to chronic exposure to nonionizing radiation: Secondary | ICD-10-CM | POA: Diagnosis not present

## 2013-02-08 DIAGNOSIS — I2510 Atherosclerotic heart disease of native coronary artery without angina pectoris: Secondary | ICD-10-CM | POA: Diagnosis not present

## 2013-02-08 DIAGNOSIS — D239 Other benign neoplasm of skin, unspecified: Secondary | ICD-10-CM | POA: Diagnosis not present

## 2013-02-08 DIAGNOSIS — J209 Acute bronchitis, unspecified: Secondary | ICD-10-CM | POA: Diagnosis not present

## 2013-02-08 DIAGNOSIS — R05 Cough: Secondary | ICD-10-CM | POA: Diagnosis not present

## 2013-02-08 DIAGNOSIS — L821 Other seborrheic keratosis: Secondary | ICD-10-CM | POA: Diagnosis not present

## 2013-02-08 DIAGNOSIS — Z85828 Personal history of other malignant neoplasm of skin: Secondary | ICD-10-CM | POA: Diagnosis not present

## 2013-02-08 DIAGNOSIS — R1310 Dysphagia, unspecified: Secondary | ICD-10-CM | POA: Diagnosis not present

## 2013-02-08 DIAGNOSIS — L820 Inflamed seborrheic keratosis: Secondary | ICD-10-CM | POA: Diagnosis not present

## 2013-02-22 DIAGNOSIS — I2510 Atherosclerotic heart disease of native coronary artery without angina pectoris: Secondary | ICD-10-CM | POA: Diagnosis not present

## 2013-02-22 DIAGNOSIS — K625 Hemorrhage of anus and rectum: Secondary | ICD-10-CM | POA: Diagnosis not present

## 2013-02-22 DIAGNOSIS — Z0000 Encounter for general adult medical examination without abnormal findings: Secondary | ICD-10-CM | POA: Diagnosis not present

## 2013-02-22 DIAGNOSIS — R5381 Other malaise: Secondary | ICD-10-CM | POA: Diagnosis not present

## 2013-02-22 DIAGNOSIS — Z79899 Other long term (current) drug therapy: Secondary | ICD-10-CM | POA: Diagnosis not present

## 2013-02-22 DIAGNOSIS — E119 Type 2 diabetes mellitus without complications: Secondary | ICD-10-CM | POA: Diagnosis not present

## 2013-02-22 DIAGNOSIS — E7800 Pure hypercholesterolemia, unspecified: Secondary | ICD-10-CM | POA: Diagnosis not present

## 2013-02-24 DIAGNOSIS — Z125 Encounter for screening for malignant neoplasm of prostate: Secondary | ICD-10-CM | POA: Diagnosis not present

## 2013-02-24 DIAGNOSIS — D649 Anemia, unspecified: Secondary | ICD-10-CM | POA: Diagnosis not present

## 2013-02-24 DIAGNOSIS — E785 Hyperlipidemia, unspecified: Secondary | ICD-10-CM | POA: Diagnosis not present

## 2013-02-24 DIAGNOSIS — E119 Type 2 diabetes mellitus without complications: Secondary | ICD-10-CM | POA: Diagnosis not present

## 2013-02-24 DIAGNOSIS — R002 Palpitations: Secondary | ICD-10-CM | POA: Diagnosis not present

## 2013-05-02 DIAGNOSIS — I4581 Long QT syndrome: Secondary | ICD-10-CM | POA: Diagnosis not present

## 2013-05-02 DIAGNOSIS — I4891 Unspecified atrial fibrillation: Secondary | ICD-10-CM | POA: Diagnosis not present

## 2013-05-02 DIAGNOSIS — Z5181 Encounter for therapeutic drug level monitoring: Secondary | ICD-10-CM | POA: Diagnosis not present

## 2013-05-02 DIAGNOSIS — Z45018 Encounter for adjustment and management of other part of cardiac pacemaker: Secondary | ICD-10-CM | POA: Diagnosis not present

## 2013-05-02 DIAGNOSIS — Z79899 Other long term (current) drug therapy: Secondary | ICD-10-CM | POA: Diagnosis not present

## 2013-05-02 DIAGNOSIS — Z7901 Long term (current) use of anticoagulants: Secondary | ICD-10-CM | POA: Diagnosis not present

## 2013-05-16 DIAGNOSIS — H4010X0 Unspecified open-angle glaucoma, stage unspecified: Secondary | ICD-10-CM | POA: Diagnosis not present

## 2013-05-24 DIAGNOSIS — H6120 Impacted cerumen, unspecified ear: Secondary | ICD-10-CM | POA: Diagnosis not present

## 2013-05-24 DIAGNOSIS — H60509 Unspecified acute noninfective otitis externa, unspecified ear: Secondary | ICD-10-CM | POA: Diagnosis not present

## 2013-06-28 DIAGNOSIS — Z950 Presence of cardiac pacemaker: Secondary | ICD-10-CM | POA: Diagnosis not present

## 2013-06-28 DIAGNOSIS — Z7901 Long term (current) use of anticoagulants: Secondary | ICD-10-CM | POA: Diagnosis not present

## 2013-06-28 DIAGNOSIS — Z952 Presence of prosthetic heart valve: Secondary | ICD-10-CM | POA: Diagnosis not present

## 2013-06-28 DIAGNOSIS — I079 Rheumatic tricuspid valve disease, unspecified: Secondary | ICD-10-CM | POA: Diagnosis not present

## 2013-06-28 DIAGNOSIS — R9431 Abnormal electrocardiogram [ECG] [EKG]: Secondary | ICD-10-CM | POA: Diagnosis not present

## 2013-06-28 DIAGNOSIS — I4891 Unspecified atrial fibrillation: Secondary | ICD-10-CM | POA: Diagnosis not present

## 2013-07-05 DIAGNOSIS — I712 Thoracic aortic aneurysm, without rupture, unspecified: Secondary | ICD-10-CM | POA: Diagnosis not present

## 2013-07-05 DIAGNOSIS — Z950 Presence of cardiac pacemaker: Secondary | ICD-10-CM | POA: Diagnosis not present

## 2013-07-05 DIAGNOSIS — R0989 Other specified symptoms and signs involving the circulatory and respiratory systems: Secondary | ICD-10-CM | POA: Diagnosis not present

## 2013-07-05 DIAGNOSIS — Z45018 Encounter for adjustment and management of other part of cardiac pacemaker: Secondary | ICD-10-CM | POA: Diagnosis not present

## 2013-07-05 DIAGNOSIS — I059 Rheumatic mitral valve disease, unspecified: Secondary | ICD-10-CM | POA: Diagnosis not present

## 2013-07-05 DIAGNOSIS — Z79899 Other long term (current) drug therapy: Secondary | ICD-10-CM | POA: Diagnosis not present

## 2013-07-05 DIAGNOSIS — I4891 Unspecified atrial fibrillation: Secondary | ICD-10-CM | POA: Diagnosis not present

## 2013-07-05 DIAGNOSIS — I4892 Unspecified atrial flutter: Secondary | ICD-10-CM | POA: Diagnosis not present

## 2013-07-05 DIAGNOSIS — R0609 Other forms of dyspnea: Secondary | ICD-10-CM | POA: Diagnosis not present

## 2013-07-05 DIAGNOSIS — Z8679 Personal history of other diseases of the circulatory system: Secondary | ICD-10-CM | POA: Diagnosis not present

## 2013-07-06 DIAGNOSIS — I4891 Unspecified atrial fibrillation: Secondary | ICD-10-CM | POA: Diagnosis not present

## 2013-07-06 DIAGNOSIS — I4892 Unspecified atrial flutter: Secondary | ICD-10-CM | POA: Diagnosis not present

## 2013-07-07 DIAGNOSIS — I999 Unspecified disorder of circulatory system: Secondary | ICD-10-CM | POA: Diagnosis not present

## 2013-07-07 DIAGNOSIS — I4891 Unspecified atrial fibrillation: Secondary | ICD-10-CM | POA: Diagnosis not present

## 2013-07-11 DIAGNOSIS — I10 Essential (primary) hypertension: Secondary | ICD-10-CM | POA: Diagnosis not present

## 2013-07-11 DIAGNOSIS — R5381 Other malaise: Secondary | ICD-10-CM | POA: Diagnosis not present

## 2013-07-11 DIAGNOSIS — R7309 Other abnormal glucose: Secondary | ICD-10-CM | POA: Diagnosis not present

## 2013-07-11 DIAGNOSIS — R5383 Other fatigue: Secondary | ICD-10-CM | POA: Diagnosis not present

## 2013-07-11 DIAGNOSIS — E119 Type 2 diabetes mellitus without complications: Secondary | ICD-10-CM | POA: Diagnosis not present

## 2013-07-11 DIAGNOSIS — D649 Anemia, unspecified: Secondary | ICD-10-CM | POA: Diagnosis not present

## 2013-07-11 DIAGNOSIS — Z79899 Other long term (current) drug therapy: Secondary | ICD-10-CM | POA: Diagnosis not present

## 2013-07-11 DIAGNOSIS — R748 Abnormal levels of other serum enzymes: Secondary | ICD-10-CM | POA: Diagnosis not present

## 2013-08-08 DIAGNOSIS — L570 Actinic keratosis: Secondary | ICD-10-CM | POA: Diagnosis not present

## 2013-08-08 DIAGNOSIS — L719 Rosacea, unspecified: Secondary | ICD-10-CM | POA: Diagnosis not present

## 2013-08-08 DIAGNOSIS — Z85828 Personal history of other malignant neoplasm of skin: Secondary | ICD-10-CM | POA: Diagnosis not present

## 2013-08-08 DIAGNOSIS — L578 Other skin changes due to chronic exposure to nonionizing radiation: Secondary | ICD-10-CM | POA: Diagnosis not present

## 2013-08-08 DIAGNOSIS — L821 Other seborrheic keratosis: Secondary | ICD-10-CM | POA: Diagnosis not present

## 2013-08-16 DIAGNOSIS — Z7901 Long term (current) use of anticoagulants: Secondary | ICD-10-CM | POA: Diagnosis not present

## 2013-08-16 DIAGNOSIS — I4891 Unspecified atrial fibrillation: Secondary | ICD-10-CM | POA: Diagnosis not present

## 2013-08-16 DIAGNOSIS — R9431 Abnormal electrocardiogram [ECG] [EKG]: Secondary | ICD-10-CM | POA: Diagnosis not present

## 2013-08-16 DIAGNOSIS — Z950 Presence of cardiac pacemaker: Secondary | ICD-10-CM | POA: Diagnosis not present

## 2013-08-16 DIAGNOSIS — Z45018 Encounter for adjustment and management of other part of cardiac pacemaker: Secondary | ICD-10-CM | POA: Diagnosis not present

## 2013-08-16 DIAGNOSIS — I491 Atrial premature depolarization: Secondary | ICD-10-CM | POA: Diagnosis not present

## 2013-08-16 DIAGNOSIS — Z79899 Other long term (current) drug therapy: Secondary | ICD-10-CM | POA: Diagnosis not present

## 2013-09-30 DIAGNOSIS — Z23 Encounter for immunization: Secondary | ICD-10-CM | POA: Diagnosis not present

## 2013-10-24 DIAGNOSIS — T6391XA Toxic effect of contact with unspecified venomous animal, accidental (unintentional), initial encounter: Secondary | ICD-10-CM | POA: Diagnosis not present

## 2013-10-24 DIAGNOSIS — R5381 Other malaise: Secondary | ICD-10-CM | POA: Diagnosis not present

## 2013-10-24 DIAGNOSIS — R7309 Other abnormal glucose: Secondary | ICD-10-CM | POA: Diagnosis not present

## 2013-10-24 DIAGNOSIS — Z79899 Other long term (current) drug therapy: Secondary | ICD-10-CM | POA: Diagnosis not present

## 2013-10-24 DIAGNOSIS — R748 Abnormal levels of other serum enzymes: Secondary | ICD-10-CM | POA: Diagnosis not present

## 2013-10-24 DIAGNOSIS — D649 Anemia, unspecified: Secondary | ICD-10-CM | POA: Diagnosis not present

## 2013-10-24 DIAGNOSIS — R5383 Other fatigue: Secondary | ICD-10-CM | POA: Diagnosis not present

## 2013-11-16 DIAGNOSIS — H4010X0 Unspecified open-angle glaucoma, stage unspecified: Secondary | ICD-10-CM | POA: Diagnosis not present

## 2013-12-05 DIAGNOSIS — I4891 Unspecified atrial fibrillation: Secondary | ICD-10-CM | POA: Diagnosis not present

## 2013-12-05 DIAGNOSIS — R9431 Abnormal electrocardiogram [ECG] [EKG]: Secondary | ICD-10-CM | POA: Diagnosis not present

## 2013-12-05 DIAGNOSIS — I959 Hypotension, unspecified: Secondary | ICD-10-CM | POA: Diagnosis not present

## 2013-12-05 DIAGNOSIS — Z7901 Long term (current) use of anticoagulants: Secondary | ICD-10-CM | POA: Diagnosis not present

## 2013-12-05 DIAGNOSIS — Z45018 Encounter for adjustment and management of other part of cardiac pacemaker: Secondary | ICD-10-CM | POA: Diagnosis not present

## 2013-12-05 DIAGNOSIS — Z5181 Encounter for therapeutic drug level monitoring: Secondary | ICD-10-CM | POA: Diagnosis not present

## 2013-12-24 DIAGNOSIS — Z23 Encounter for immunization: Secondary | ICD-10-CM | POA: Diagnosis not present

## 2014-01-17 DIAGNOSIS — E782 Mixed hyperlipidemia: Secondary | ICD-10-CM | POA: Insufficient documentation

## 2014-01-17 DIAGNOSIS — G4733 Obstructive sleep apnea (adult) (pediatric): Secondary | ICD-10-CM | POA: Diagnosis not present

## 2014-01-17 DIAGNOSIS — I480 Paroxysmal atrial fibrillation: Secondary | ICD-10-CM | POA: Diagnosis not present

## 2014-01-17 DIAGNOSIS — I482 Chronic atrial fibrillation: Secondary | ICD-10-CM | POA: Diagnosis not present

## 2014-01-23 DIAGNOSIS — G4733 Obstructive sleep apnea (adult) (pediatric): Secondary | ICD-10-CM | POA: Diagnosis not present

## 2014-01-23 DIAGNOSIS — I480 Paroxysmal atrial fibrillation: Secondary | ICD-10-CM | POA: Diagnosis not present

## 2014-01-23 DIAGNOSIS — E782 Mixed hyperlipidemia: Secondary | ICD-10-CM | POA: Diagnosis not present

## 2014-01-30 DIAGNOSIS — I480 Paroxysmal atrial fibrillation: Secondary | ICD-10-CM | POA: Diagnosis not present

## 2014-01-30 DIAGNOSIS — E782 Mixed hyperlipidemia: Secondary | ICD-10-CM | POA: Diagnosis not present

## 2014-01-30 DIAGNOSIS — G4733 Obstructive sleep apnea (adult) (pediatric): Secondary | ICD-10-CM | POA: Diagnosis not present

## 2014-02-09 DIAGNOSIS — D229 Melanocytic nevi, unspecified: Secondary | ICD-10-CM | POA: Diagnosis not present

## 2014-02-09 DIAGNOSIS — D485 Neoplasm of uncertain behavior of skin: Secondary | ICD-10-CM | POA: Diagnosis not present

## 2014-02-09 DIAGNOSIS — Z85828 Personal history of other malignant neoplasm of skin: Secondary | ICD-10-CM | POA: Diagnosis not present

## 2014-02-09 DIAGNOSIS — L570 Actinic keratosis: Secondary | ICD-10-CM | POA: Diagnosis not present

## 2014-02-09 DIAGNOSIS — L821 Other seborrheic keratosis: Secondary | ICD-10-CM | POA: Diagnosis not present

## 2014-02-09 DIAGNOSIS — L820 Inflamed seborrheic keratosis: Secondary | ICD-10-CM | POA: Diagnosis not present

## 2014-02-09 DIAGNOSIS — L578 Other skin changes due to chronic exposure to nonionizing radiation: Secondary | ICD-10-CM | POA: Diagnosis not present

## 2014-02-09 DIAGNOSIS — Z1283 Encounter for screening for malignant neoplasm of skin: Secondary | ICD-10-CM | POA: Diagnosis not present

## 2014-02-14 DIAGNOSIS — Z79899 Other long term (current) drug therapy: Secondary | ICD-10-CM | POA: Diagnosis not present

## 2014-02-14 DIAGNOSIS — Z950 Presence of cardiac pacemaker: Secondary | ICD-10-CM | POA: Diagnosis not present

## 2014-02-14 DIAGNOSIS — Z7901 Long term (current) use of anticoagulants: Secondary | ICD-10-CM | POA: Diagnosis not present

## 2014-02-14 DIAGNOSIS — I480 Paroxysmal atrial fibrillation: Secondary | ICD-10-CM | POA: Diagnosis not present

## 2014-03-30 DIAGNOSIS — I4891 Unspecified atrial fibrillation: Secondary | ICD-10-CM | POA: Diagnosis not present

## 2014-03-30 DIAGNOSIS — Z0000 Encounter for general adult medical examination without abnormal findings: Secondary | ICD-10-CM | POA: Diagnosis not present

## 2014-03-30 DIAGNOSIS — Z23 Encounter for immunization: Secondary | ICD-10-CM | POA: Diagnosis not present

## 2014-03-31 DIAGNOSIS — Z125 Encounter for screening for malignant neoplasm of prostate: Secondary | ICD-10-CM | POA: Diagnosis not present

## 2014-03-31 DIAGNOSIS — I10 Essential (primary) hypertension: Secondary | ICD-10-CM | POA: Diagnosis not present

## 2014-03-31 DIAGNOSIS — E785 Hyperlipidemia, unspecified: Secondary | ICD-10-CM | POA: Diagnosis not present

## 2014-03-31 DIAGNOSIS — E119 Type 2 diabetes mellitus without complications: Secondary | ICD-10-CM | POA: Diagnosis not present

## 2014-03-31 DIAGNOSIS — D696 Thrombocytopenia, unspecified: Secondary | ICD-10-CM | POA: Diagnosis not present

## 2014-03-31 LAB — CBC AND DIFFERENTIAL
HCT: 46 % (ref 41–53)
HEMOGLOBIN: 16.3 g/dL (ref 13.5–17.5)
Neutrophils Absolute: 48 /uL
PLATELETS: 113 10*3/uL — AB (ref 150–399)
WBC: 4.5 10^3/mL

## 2014-03-31 LAB — LIPID PANEL
Cholesterol: 190 mg/dL (ref 0–200)
HDL: 63 mg/dL (ref 35–70)
LDL CALC: 111 mg/dL
LDl/HDL Ratio: 1.8
TRIGLYCERIDES: 79 mg/dL (ref 40–160)

## 2014-03-31 LAB — HEPATIC FUNCTION PANEL
ALT: 86 U/L — AB (ref 10–40)
AST: 83 U/L — AB (ref 14–40)
Alkaline Phosphatase: 64 U/L (ref 25–125)
Bilirubin, Total: 0.8 mg/dL

## 2014-03-31 LAB — BASIC METABOLIC PANEL
BUN: 13 mg/dL (ref 4–21)
Creatinine: 1 mg/dL (ref <=1.3)
GLUCOSE: 92 mg/dL
Potassium: 4.5 mmol/L (ref 3.4–5.3)
SODIUM: 144 mmol/L (ref 137–147)

## 2014-03-31 LAB — PSA: PSA: 0.4

## 2014-03-31 LAB — HEMOGLOBIN A1C: HEMOGLOBIN A1C: 6.2 % — AB (ref 4.0–6.0)

## 2014-03-31 LAB — TSH: TSH: 1.79 u[IU]/mL (ref <=5.90)

## 2014-05-03 DIAGNOSIS — E782 Mixed hyperlipidemia: Secondary | ICD-10-CM | POA: Diagnosis not present

## 2014-05-03 DIAGNOSIS — Z79899 Other long term (current) drug therapy: Secondary | ICD-10-CM | POA: Diagnosis not present

## 2014-05-03 DIAGNOSIS — I480 Paroxysmal atrial fibrillation: Secondary | ICD-10-CM | POA: Diagnosis not present

## 2014-05-03 DIAGNOSIS — G4730 Sleep apnea, unspecified: Secondary | ICD-10-CM | POA: Insufficient documentation

## 2014-05-03 DIAGNOSIS — I482 Chronic atrial fibrillation: Secondary | ICD-10-CM | POA: Diagnosis not present

## 2014-05-17 DIAGNOSIS — H4011X2 Primary open-angle glaucoma, moderate stage: Secondary | ICD-10-CM | POA: Diagnosis not present

## 2014-05-23 DIAGNOSIS — M7022 Olecranon bursitis, left elbow: Secondary | ICD-10-CM | POA: Diagnosis not present

## 2014-05-23 DIAGNOSIS — Z0000 Encounter for general adult medical examination without abnormal findings: Secondary | ICD-10-CM | POA: Diagnosis not present

## 2014-05-23 DIAGNOSIS — Z23 Encounter for immunization: Secondary | ICD-10-CM | POA: Diagnosis not present

## 2014-05-24 DIAGNOSIS — Z45018 Encounter for adjustment and management of other part of cardiac pacemaker: Secondary | ICD-10-CM | POA: Diagnosis not present

## 2014-05-24 DIAGNOSIS — I480 Paroxysmal atrial fibrillation: Secondary | ICD-10-CM | POA: Diagnosis not present

## 2014-05-24 DIAGNOSIS — Z950 Presence of cardiac pacemaker: Secondary | ICD-10-CM | POA: Diagnosis not present

## 2014-05-24 DIAGNOSIS — I4581 Long QT syndrome: Secondary | ICD-10-CM | POA: Diagnosis not present

## 2014-06-08 DIAGNOSIS — I351 Nonrheumatic aortic (valve) insufficiency: Secondary | ICD-10-CM | POA: Diagnosis not present

## 2014-06-08 DIAGNOSIS — I480 Paroxysmal atrial fibrillation: Secondary | ICD-10-CM | POA: Diagnosis not present

## 2014-06-08 DIAGNOSIS — G4733 Obstructive sleep apnea (adult) (pediatric): Secondary | ICD-10-CM | POA: Diagnosis not present

## 2014-06-08 DIAGNOSIS — I361 Nonrheumatic tricuspid (valve) insufficiency: Secondary | ICD-10-CM | POA: Diagnosis not present

## 2014-06-20 DIAGNOSIS — I472 Ventricular tachycardia, unspecified: Secondary | ICD-10-CM | POA: Insufficient documentation

## 2014-06-20 DIAGNOSIS — I4729 Other ventricular tachycardia: Secondary | ICD-10-CM | POA: Insufficient documentation

## 2014-07-14 NOTE — Op Note (Signed)
PATIENT NAME:  Jeremy Kane, Jeremy Kane MR#:  [id number] DATE OF BIRTH:  07/30/1940  DATE OF PROCEDURE:  10/28/2012  PREOPERATIVE DIAGNOSIS:  Symptomatic umbilical hernia with incarcerated preperitoneal fat.   POSTOPERATIVE DIAGNOSIS:  Symptomatic umbilical hernia with incarcerated preperitoneal fat.  OPERATIVE PROCEDURE:  Repair of umbilical hernia with primary technique.   SURGEON:  Hervey Ard, M.D.   ANESTHESIA:  General by LMA under Dr. Kayleen Memos, Marcaine 0.5% plain, 30 mL local infiltration, Toradol 30 mg.   CLINICAL NOTE:  This 74 year old male has developed an increasingly symptomatic umbilical hernia.  This is no longer reducible.  He has no obstructive symptoms.  He is brought to the Operating Room for planned elective repair.   OPERATIVE NOTE:  With the patient under adequate general anesthesia and hair previously being removed from the area with clippers, the abdomen was prepped with ChloraPrep and draped.  He did receive Kefzol intravenously in the event prosthetic material was required.  An infraumbilical incision was made after the instillation of the above-mentioned Marcaine for postoperative analgesia.  The skin was incised sharply and the remaining dissection completed with electrocautery.  The skin was elevated off the hernia sac which was excised and discarded.  The area was dissected at the level of the fascia and the preperitoneal fat returned to its normal location.  The inferior surface was cleared and the defect, measuring only 2 cm in diameter was felt appropriate for primary repair.  Interrupted 0 Surgilon sutures were utilized.  Toradol was placed into the tissues for postoperative analgesia, 30 mg total.  The umbilical skin was tacked to the fascia with 2-0 Vicryl suture.  The deep layer was then closed with a running 2-0 Vicryl and the skin closed with a running 4-0 Vicryl subcuticular suture.  Benzoin and Steri-Strips followed by Telfa and Tegaderm dressing were then applied.     The patient tolerated the procedure well and was taken to the recovery room in stable condition.    ____________________________ Robert Bellow, MD jwb:ea D: 10/28/2012 21:14:13 ET T: 10/29/2012 04:54:25 ET JOB#: [id number]  cc: Robert Bellow, MD, <Dictator> Richard L. Rosanna Randy, MD Robert Bellow, MD Emersen Carroll Amedeo Kinsman MD ELECTRONICALLY SIGNED 10/30/2012 9:35

## 2014-07-24 DIAGNOSIS — Z0000 Encounter for general adult medical examination without abnormal findings: Secondary | ICD-10-CM | POA: Diagnosis not present

## 2014-07-24 DIAGNOSIS — M7022 Olecranon bursitis, left elbow: Secondary | ICD-10-CM | POA: Diagnosis not present

## 2014-07-24 DIAGNOSIS — Z23 Encounter for immunization: Secondary | ICD-10-CM | POA: Diagnosis not present

## 2014-08-07 DIAGNOSIS — D649 Anemia, unspecified: Secondary | ICD-10-CM | POA: Insufficient documentation

## 2014-08-07 DIAGNOSIS — E785 Hyperlipidemia, unspecified: Secondary | ICD-10-CM | POA: Insufficient documentation

## 2014-08-07 DIAGNOSIS — K635 Polyp of colon: Secondary | ICD-10-CM | POA: Insufficient documentation

## 2014-08-07 DIAGNOSIS — I079 Rheumatic tricuspid valve disease, unspecified: Secondary | ICD-10-CM | POA: Insufficient documentation

## 2014-08-07 DIAGNOSIS — I719 Aortic aneurysm of unspecified site, without rupture: Secondary | ICD-10-CM | POA: Insufficient documentation

## 2014-08-07 DIAGNOSIS — E119 Type 2 diabetes mellitus without complications: Secondary | ICD-10-CM | POA: Insufficient documentation

## 2014-08-07 DIAGNOSIS — I10 Essential (primary) hypertension: Secondary | ICD-10-CM | POA: Insufficient documentation

## 2014-08-07 DIAGNOSIS — D696 Thrombocytopenia, unspecified: Secondary | ICD-10-CM | POA: Insufficient documentation

## 2014-08-09 DIAGNOSIS — M7022 Olecranon bursitis, left elbow: Secondary | ICD-10-CM | POA: Diagnosis not present

## 2014-08-10 DIAGNOSIS — D485 Neoplasm of uncertain behavior of skin: Secondary | ICD-10-CM | POA: Diagnosis not present

## 2014-08-10 DIAGNOSIS — L821 Other seborrheic keratosis: Secondary | ICD-10-CM | POA: Diagnosis not present

## 2014-08-10 DIAGNOSIS — L570 Actinic keratosis: Secondary | ICD-10-CM | POA: Diagnosis not present

## 2014-08-10 DIAGNOSIS — D229 Melanocytic nevi, unspecified: Secondary | ICD-10-CM | POA: Diagnosis not present

## 2014-08-10 DIAGNOSIS — Z1283 Encounter for screening for malignant neoplasm of skin: Secondary | ICD-10-CM | POA: Diagnosis not present

## 2014-08-10 DIAGNOSIS — L814 Other melanin hyperpigmentation: Secondary | ICD-10-CM | POA: Diagnosis not present

## 2014-08-10 DIAGNOSIS — Z85828 Personal history of other malignant neoplasm of skin: Secondary | ICD-10-CM | POA: Diagnosis not present

## 2014-08-10 DIAGNOSIS — L578 Other skin changes due to chronic exposure to nonionizing radiation: Secondary | ICD-10-CM | POA: Diagnosis not present

## 2014-08-10 DIAGNOSIS — D1800 Hemangioma unspecified site: Secondary | ICD-10-CM | POA: Diagnosis not present

## 2014-08-24 DIAGNOSIS — M7022 Olecranon bursitis, left elbow: Secondary | ICD-10-CM | POA: Diagnosis not present

## 2014-09-04 ENCOUNTER — Other Ambulatory Visit: Payer: Self-pay

## 2014-09-05 ENCOUNTER — Encounter
Admission: RE | Admit: 2014-09-05 | Discharge: 2014-09-05 | Disposition: A | Discharge status: Home / Self Care | Payer: Medicare Other | Source: Ambulatory Visit | Attending: Specialist | Admitting: Specialist

## 2014-09-05 DIAGNOSIS — I739 Peripheral vascular disease, unspecified: Secondary | ICD-10-CM | POA: Diagnosis not present

## 2014-09-05 DIAGNOSIS — Z882 Allergy status to sulfonamides status: Secondary | ICD-10-CM | POA: Diagnosis not present

## 2014-09-05 DIAGNOSIS — D696 Thrombocytopenia, unspecified: Secondary | ICD-10-CM | POA: Diagnosis not present

## 2014-09-05 DIAGNOSIS — Z7901 Long term (current) use of anticoagulants: Secondary | ICD-10-CM | POA: Diagnosis not present

## 2014-09-05 DIAGNOSIS — M7022 Olecranon bursitis, left elbow: Secondary | ICD-10-CM | POA: Diagnosis not present

## 2014-09-05 DIAGNOSIS — I10 Essential (primary) hypertension: Secondary | ICD-10-CM | POA: Diagnosis not present

## 2014-09-05 DIAGNOSIS — I4891 Unspecified atrial fibrillation: Secondary | ICD-10-CM | POA: Diagnosis not present

## 2014-09-05 DIAGNOSIS — E119 Type 2 diabetes mellitus without complications: Secondary | ICD-10-CM | POA: Diagnosis not present

## 2014-09-05 DIAGNOSIS — Z9889 Other specified postprocedural states: Secondary | ICD-10-CM | POA: Diagnosis not present

## 2014-09-05 DIAGNOSIS — Z87891 Personal history of nicotine dependence: Secondary | ICD-10-CM | POA: Diagnosis not present

## 2014-09-05 DIAGNOSIS — Z79899 Other long term (current) drug therapy: Secondary | ICD-10-CM | POA: Diagnosis not present

## 2014-09-05 HISTORY — DX: Cardiac arrhythmia, unspecified: I49.9

## 2014-09-05 LAB — CBC
HEMATOCRIT: 46.3 % (ref 40.0–52.0)
HEMOGLOBIN: 15.4 g/dL (ref 13.0–18.0)
MCH: 34.7 pg — AB (ref 26.0–34.0)
MCHC: 33.2 g/dL (ref 32.0–36.0)
MCV: 104.5 fL — AB (ref 80.0–100.0)
Platelets: 107 10*3/uL — ABNORMAL LOW (ref 150–440)
RBC: 4.43 MIL/uL (ref 4.40–5.90)
RDW: 13.5 % (ref 11.5–14.5)
WBC: 5.6 10*3/uL (ref 3.8–10.6)

## 2014-09-05 NOTE — Patient Instructions (Addendum)
  Your procedure is scheduled on: Friday 6/17 Report to [facility] Entr[facility]. To find out your arrival time please call [phone] between 1PM - 3PM on Thursday 6/16.  Remember: Instructions that are not followed completely may result in serious medical risk, up to and including death, or upon the discretion of your surgeon and anesthesiologist your surgery may need to be rescheduled.    __x__ 1. Do not eat food or drink liquids after midnight. No gum chewing or hard candies.     __x__ 2. No Alcohol for 24 hours before or after surgery.   ____ 3. Bring all medications with you on the day of surgery if instructed.    __x__ 4. Notify your doctor if there is any change in your medical condition     (cold, fever, infections).     Do not wear jewelry, make-up, hairpins, clips or nail polish.  Do not wear lotions, powders, or perfumes.   Do not shave 48 hours prior to surgery. Men may shave face and neck.  Do not bring valuables to the hospital.    [facility] is not responsible for any belongings or valuables.               Contacts, dentures or bridgework may not be worn into surgery.  Leave your suitcase in the car. After surgery it may be brought to your room.  For patients admitted to the hospital, discharge time is determined by your                treatment team.   Patients discharged the day of surgery will not be allowed to drive home.   Please read over the following fact sheets that you were given:   Surgical Site Infection Prevention   __x__ Take these medicines the morning of surgery with A SIP OF WATER:    1. metoprolol  2. tikosyn  3. magnesium  4. lisinopril  5.  6.  ____ Fleet Enema (as directed)   __x__ Use CHG Soap as directed  ____ Use inhalers on the day of surgery  ____ Stop metformin 2 days prior to surgery    ____ Take 1/2 of usual insulin dose the night before surgery and none on the morning of surgery.   __x__ Stop  Coumadin/Plavix/aspirin on Contact Cardiologist and/or Dr. Tamala Julian regarding ability to stop your PRADAXA  ____ Stop Anti-inflammatories on    ____ Stop supplements until after surgery.    __x__ Bring C-Pap to the hospital.

## 2014-09-07 DIAGNOSIS — I480 Paroxysmal atrial fibrillation: Secondary | ICD-10-CM | POA: Diagnosis not present

## 2014-09-07 DIAGNOSIS — I361 Nonrheumatic tricuspid (valve) insufficiency: Secondary | ICD-10-CM | POA: Diagnosis not present

## 2014-09-07 DIAGNOSIS — E782 Mixed hyperlipidemia: Secondary | ICD-10-CM | POA: Diagnosis not present

## 2014-09-08 ENCOUNTER — Ambulatory Visit: Payer: Medicare Other | Admitting: Anesthesiology

## 2014-09-08 ENCOUNTER — Encounter
Admission: RE | Disposition: A | Discharge status: Home / Self Care | Payer: Self-pay | Source: Ambulatory Visit | Attending: Specialist

## 2014-09-08 ENCOUNTER — Encounter: Payer: Self-pay | Admitting: Specialist

## 2014-09-08 ENCOUNTER — Ambulatory Visit
Admission: RE | Admit: 2014-09-08 | Discharge: 2014-09-08 | Disposition: A | Discharge status: Home / Self Care | Payer: Medicare Other | Source: Ambulatory Visit | Attending: Specialist | Admitting: Specialist

## 2014-09-08 DIAGNOSIS — I10 Essential (primary) hypertension: Secondary | ICD-10-CM | POA: Insufficient documentation

## 2014-09-08 DIAGNOSIS — Z882 Allergy status to sulfonamides status: Secondary | ICD-10-CM | POA: Insufficient documentation

## 2014-09-08 DIAGNOSIS — Z87891 Personal history of nicotine dependence: Secondary | ICD-10-CM | POA: Insufficient documentation

## 2014-09-08 DIAGNOSIS — Z9889 Other specified postprocedural states: Secondary | ICD-10-CM | POA: Diagnosis not present

## 2014-09-08 DIAGNOSIS — D696 Thrombocytopenia, unspecified: Secondary | ICD-10-CM | POA: Insufficient documentation

## 2014-09-08 DIAGNOSIS — Z79899 Other long term (current) drug therapy: Secondary | ICD-10-CM | POA: Insufficient documentation

## 2014-09-08 DIAGNOSIS — E119 Type 2 diabetes mellitus without complications: Secondary | ICD-10-CM | POA: Insufficient documentation

## 2014-09-08 DIAGNOSIS — I739 Peripheral vascular disease, unspecified: Secondary | ICD-10-CM | POA: Insufficient documentation

## 2014-09-08 DIAGNOSIS — M7022 Olecranon bursitis, left elbow: Secondary | ICD-10-CM | POA: Diagnosis not present

## 2014-09-08 DIAGNOSIS — Z7901 Long term (current) use of anticoagulants: Secondary | ICD-10-CM | POA: Insufficient documentation

## 2014-09-08 DIAGNOSIS — I4891 Unspecified atrial fibrillation: Secondary | ICD-10-CM | POA: Insufficient documentation

## 2014-09-08 HISTORY — PX: OLECRANON BURSECTOMY: SHX2097

## 2014-09-08 LAB — GLUCOSE, CAPILLARY: GLUCOSE-CAPILLARY: 103 mg/dL — AB (ref 65–99)

## 2014-09-08 SURGERY — BURSECTOMY, ELBOW
Anesthesia: General | Laterality: Left

## 2014-09-08 MED ORDER — PROPOFOL 10 MG/ML IV BOLUS
INTRAVENOUS | Status: DC | PRN
  Administered 2014-09-08 (×2): 50 mg via INTRAVENOUS
  Administered 2014-09-08: 150 mg via INTRAVENOUS

## 2014-09-08 MED ORDER — CEFAZOLIN SODIUM 1 G IJ SOLR
INTRAMUSCULAR | Status: AC
  Filled 2014-09-08: qty 10

## 2014-09-08 MED ORDER — SUCCINYLCHOLINE CHLORIDE 20 MG/ML IJ SOLN
INTRAMUSCULAR | Status: DC | PRN
  Administered 2014-09-08 (×2): 100 mg via INTRAVENOUS

## 2014-09-08 MED ORDER — ONDANSETRON HCL 4 MG/2ML IJ SOLN: 4.0000 mg | Freq: Once | INTRAMUSCULAR | Status: DC | PRN

## 2014-09-08 MED ORDER — FENTANYL CITRATE (PF) 100 MCG/2ML IJ SOLN: 25.0000 ug | INTRAMUSCULAR | Status: DC | PRN

## 2014-09-08 MED ORDER — CEFAZOLIN SODIUM-DEXTROSE 2-3 GM-% IV SOLR
2.0000 g | Freq: Once | INTRAVENOUS | Status: AC
  Administered 2014-09-08: 2 g via INTRAVENOUS

## 2014-09-08 MED ORDER — KETOROLAC TROMETHAMINE 30 MG/ML IJ SOLN
INTRAMUSCULAR | Status: DC | PRN
  Administered 2014-09-08: 15 mg via INTRAVENOUS

## 2014-09-08 MED ORDER — FENTANYL CITRATE (PF) 100 MCG/2ML IJ SOLN
INTRAMUSCULAR | Status: DC | PRN
  Administered 2014-09-08 (×2): 50 ug via INTRAVENOUS

## 2014-09-08 MED ORDER — LIDOCAINE HCL (PF) 1 % IJ SOLN
INTRAMUSCULAR | Status: AC
  Filled 2014-09-08: qty 30

## 2014-09-08 MED ORDER — MIDAZOLAM HCL 2 MG/2ML IJ SOLN
INTRAMUSCULAR | Status: DC | PRN
  Administered 2014-09-08: 2 mg via INTRAVENOUS

## 2014-09-08 MED ORDER — SODIUM CHLORIDE 0.9 % IV SOLN
INTRAVENOUS | Status: DC
  Administered 2014-09-08: 11:00:00 via INTRAVENOUS
  Administered 2014-09-08: 50 mL/h via INTRAVENOUS

## 2014-09-08 MED ORDER — BUPIVACAINE HCL (PF) 0.5 % IJ SOLN
INTRAMUSCULAR | Status: AC
  Filled 2014-09-08: qty 30

## 2014-09-08 MED ORDER — HYDRALAZINE HCL 20 MG/ML IJ SOLN
10.0000 mg | Freq: Once | INTRAMUSCULAR | Status: AC
  Administered 2014-09-08: 10 mg via INTRAVENOUS

## 2014-09-08 MED ORDER — FAMOTIDINE 20 MG PO TABS
20.0000 mg | ORAL_TABLET | Freq: Once | ORAL | Status: AC
  Administered 2014-09-08: 20 mg via ORAL

## 2014-09-08 MED ORDER — DEXAMETHASONE SODIUM PHOSPHATE 4 MG/ML IJ SOLN
INTRAMUSCULAR | Status: DC | PRN
  Administered 2014-09-08: 10 mg via INTRAVENOUS

## 2014-09-08 MED ORDER — CEFAZOLIN SODIUM-DEXTROSE 2-3 GM-% IV SOLR
INTRAVENOUS | Status: AC
  Filled 2014-09-08: qty 50

## 2014-09-08 MED ORDER — HYDRALAZINE HCL 20 MG/ML IJ SOLN
INTRAMUSCULAR | Status: AC
  Administered 2014-09-08: 10 mg via INTRAVENOUS
  Filled 2014-09-08: qty 1

## 2014-09-08 MED ORDER — ONDANSETRON HCL 4 MG/2ML IJ SOLN
INTRAMUSCULAR | Status: DC | PRN
  Administered 2014-09-08: 4 mg via INTRAVENOUS

## 2014-09-08 MED ORDER — HYDROCODONE-ACETAMINOPHEN 5-325 MG PO TABS: 1.0000 | ORAL_TABLET | Freq: Four times a day (QID) | ORAL | Status: DC | PRN

## 2014-09-08 MED ORDER — LIDOCAINE HCL (CARDIAC) 20 MG/ML IV SOLN
INTRAVENOUS | Status: DC | PRN
Start: 2014-09-08 — End: 2014-09-08
  Administered 2014-09-08: 80 mg via INTRAVENOUS

## 2014-09-08 MED ORDER — BUPIVACAINE HCL 0.5 % IJ SOLN
INTRAMUSCULAR | Status: DC | PRN
  Administered 2014-09-08: 7 mL

## 2014-09-08 MED ORDER — PHENYLEPHRINE HCL 10 MG/ML IJ SOLN
INTRAMUSCULAR | Status: DC | PRN
  Administered 2014-09-08: 200 ug via INTRAVENOUS
  Administered 2014-09-08 (×4): 100 ug via INTRAVENOUS
  Administered 2014-09-08: 200 ug via INTRAVENOUS

## 2014-09-08 MED ORDER — FAMOTIDINE 20 MG PO TABS
ORAL_TABLET | ORAL | Status: AC
  Administered 2014-09-08: 20 mg via ORAL
  Filled 2014-09-08: qty 1

## 2014-09-08 SURGICAL SUPPLY — 36 items
BLADE SURG SZ10 CARB STEEL (BLADE) ×3 IMPLANT
BNDG COHESIVE 4X5 TAN STRL (GAUZE/BANDAGES/DRESSINGS) ×3 IMPLANT
BNDG ESMARK 4X12 TAN STRL LF (GAUZE/BANDAGES/DRESSINGS) ×3 IMPLANT
CANISTER SUCT 1200ML W/VALVE (MISCELLANEOUS) ×3 IMPLANT
CHLORAPREP W/TINT 26ML (MISCELLANEOUS) ×3 IMPLANT
CORD BIP STRL DISP 12FT (MISCELLANEOUS) ×3 IMPLANT
DECANTER SPIKE VIAL GLASS SM (MISCELLANEOUS) ×3 IMPLANT
FORCEPS MARYLAND BIPOLAR 8X55 (INSTRUMENTS) ×2
FORCEPS MRYLND BPLR 8X55 DVNC (INSTRUMENTS) ×1 IMPLANT
GLOVE BIO SURGEON STRL SZ8 (GLOVE) ×3 IMPLANT
GLOVE SURG ORTHO 8.5 STRL (GLOVE) ×3 IMPLANT
GOWN STRL REUS W/ TWL LRG LVL3 (GOWN DISPOSABLE) ×2 IMPLANT
GOWN STRL REUS W/TWL LRG LVL3 (GOWN DISPOSABLE) ×4
KIT RM TURNOVER STRD PROC AR (KITS) ×3 IMPLANT
LABEL OR SOLS (LABEL) ×3 IMPLANT
NDL SAFETY 18GX1.5 (NEEDLE) ×3 IMPLANT
NS IRRIG 1000ML POUR BTL (IV SOLUTION) ×3 IMPLANT
PACK EXTREMITY ARMC (MISCELLANEOUS) ×3 IMPLANT
PAD ABD DERMACEA PRESS 5X9 (GAUZE/BANDAGES/DRESSINGS) ×3 IMPLANT
PAD CAST CTTN 4X4 STRL (SOFTGOODS) ×1 IMPLANT
PAD GROUND ADULT SPLIT (MISCELLANEOUS) ×3 IMPLANT
PADDING CAST COTTON 4X4 STRL (SOFTGOODS) ×2
SLING ARM LRG DEEP (SOFTGOODS) ×3 IMPLANT
SPLINT CAST 1 STEP 3X12 (MISCELLANEOUS) IMPLANT
SPONGE LAP 18X18 5 PK (GAUZE/BANDAGES/DRESSINGS) ×3 IMPLANT
STAPLER SKIN PROX 35W (STAPLE) ×3 IMPLANT
STOCKINETTE BIAS CUT 4 980044 (GAUZE/BANDAGES/DRESSINGS) IMPLANT
STOCKINETTE IMPERVIOUS 9X36 MD (GAUZE/BANDAGES/DRESSINGS) ×3 IMPLANT
SUT ETHILON 4-0 (SUTURE)
SUT ETHILON 4-0 FS2 18XMFL BLK (SUTURE)
SUT VIC AB 2-0 CT1 36 (SUTURE) IMPLANT
SUT VIC AB 4-0 SH 27 (SUTURE)
SUT VIC AB 4-0 SH 27XANBCTRL (SUTURE) IMPLANT
SUT VICRYL+ 3-0 36IN CT-1 (SUTURE) ×3 IMPLANT
SUTURE ETHLN 4-0 FS2 18XMF BLK (SUTURE) IMPLANT
SYRINGE 10CC LL (SYRINGE) ×3 IMPLANT

## 2014-09-08 NOTE — Transfer of Care (Signed)
Immediate Anesthesia Transfer of Care Note  Patient: Jeremy Kane  Procedure(s) Performed: Procedure(s): OLECRANON BURSA (Left)  Patient Location: PACU  Anesthesia Type:General  Level of Consciousness: awake  Airway & Oxygen Therapy: Patient Spontanous Breathing and Patient connected to face mask oxygen  Post-op Assessment: Report given to RN and Post -op Vital signs reviewed and stable  Post vital signs: stable  Last Vitals:  Filed Vitals:   09/08/14 1054  BP: 148/105  Pulse: 61  Temp: 36.1 C  Resp: 17    Complications: No apparent anesthesia complications

## 2014-09-08 NOTE — Anesthesia Preprocedure Evaluation (Signed)
Anesthesia Evaluation  Patient identified by MRN, date of birth, ID band Patient awake    Reviewed: Allergy & Precautions, NPO status , Patient's Chart, lab work & pertinent test results, reviewed documented beta blocker date and time   Airway Mallampati: III  TM Distance: <3 FB Neck ROM: Full   Comment: May be anterior, but no prior Hx of anesthetic difficulties Dental  (+) Caps, Chipped   Pulmonary former smoker,  breath sounds clear to auscultation  Pulmonary exam normal       Cardiovascular hypertension, + Peripheral Vascular Disease + dysrhythmias Atrial Fibrillation Rhythm:Regular  Paced   Neuro/Psych negative neurological ROS  negative psych ROS   GI/Hepatic negative GI ROS, Neg liver ROS,   Endo/Other  diabetes, Well Controlled, Type 2  Renal/GU negative Renal ROS  negative genitourinary   Musculoskeletal bursa   Abdominal Normal abdominal exam  (+)   Peds negative pediatric ROS (+)  Hematology  (+) anemia , dECREASED PLATELETS   Anesthesia Other Findings   Reproductive/Obstetrics                             Anesthesia Physical Anesthesia Plan  ASA: III  Anesthesia Plan: General   Post-op Pain Management:    Induction: Intravenous  Airway Management Planned: Oral ETT  Additional Equipment:   Intra-op Plan:   Post-operative Plan: Extubation in OR  Informed Consent: I have reviewed the patients History and Physical, chart, labs and discussed the procedure including the risks, benefits and alternatives for the proposed anesthesia with the patient or authorized representative who has indicated his/her understanding and acceptance.   Dental advisory given  Plan Discussed with: CRNA and Surgeon  Anesthesia Plan Comments:         Anesthesia Quick Evaluation

## 2014-09-08 NOTE — Discharge Instructions (Signed)
May remove sling as necessary Keep elbow clean and dry and intact If some bleeding shows up on bandage, reinforce with tape

## 2014-09-08 NOTE — H&P (Signed)
  74 year old male for left upper extremity surgery.  Full history and physical has been inserted into the chart as a paper document which is the patient's last office visit.  Medical clearance obtained from Dr Serafina Royals.  Today, heart and lungs are clear.  ENT exam normal.  Plan: excision olecranon bursa left elbow.

## 2014-09-08 NOTE — OR Nursing (Signed)
Used white vessel loop as drain in left elbow

## 2014-09-08 NOTE — Anesthesia Postprocedure Evaluation (Signed)
  Anesthesia Post-op Note  Patient: Jeremy Kane  Procedure(s) Performed: Procedure(s): OLECRANON BURSA (Left)  Anesthesia type:General  Patient location: PACU  Post pain: Pain level controlled  Post assessment: Post-op Vital signs reviewed, Patient's Cardiovascular Status Stable, Respiratory Function Stable, Patent Airway and No signs of Nausea or vomiting  Post vital signs: Reviewed and stable  Last Vitals:  Filed Vitals:   09/08/14 1258  BP: 113/77  Pulse: 70  Temp:   Resp: 16    Level of consciousness: awake, alert  and patient cooperative  Complications: No apparent anesthesia complications.  Patient did have anterior cords which required the use of the glidescope with good visualization of the cords after conventional laryngoscopy failed.  Pt intubated easily with glidescope

## 2014-09-08 NOTE — Brief Op Note (Signed)
09/08/2014  10:55 AM  PATIENT:  Grandville Silos  74 y.o. male  PRE-OPERATIVE DIAGNOSIS:  OLECRANON BUSITIS- left elbow  POST-OPERATIVE DIAGNOSIS:  olecranon bursitis left elbow  PROCEDURE:  Procedure(s): OLECRANON BURSA (Left)  SURGEON:  Surgeon(s) and Role:    * Christophe Louis, MD - Primary  PHYSICIAN ASSISTANT:   ASSISTANTS: none   ANESTHESIA:   general  EBL:  Total I/O In: 1000 [I.V.:1000] Out: -   BLOOD ADMINISTERED:none  DRAINS: Penrose drain in the elbow   LOCAL MEDICATIONS USED:  MARCAINE     SPECIMEN:  No Specimen  DISPOSITION OF SPECIMEN:  PATHOLOGY  COUNTS:  YES  TOURNIQUET:  * Missing tourniquet times found for documented tourniquets in log:  [id number] *  DICTATION: .Other Dictation: Dictation Number [id number]  PLAN OF CARE: Discharge to home after PACU  PATIENT DISPOSITION:  PACU - hemodynamically stable.   Delay start of Pharmacological VTE agent (>24hrs) due to surgical blood loss or risk of bleeding: not applicable

## 2014-09-08 NOTE — Anesthesia Procedure Notes (Addendum)
Procedure Name: Intubation Date/Time: 09/08/2014 9:46 AM Performed by: Aline Brochure Pre-anesthesia Checklist: Patient identified, Emergency Drugs available, Suction available and Patient being monitored Patient Re-evaluated:Patient Re-evaluated prior to inductionOxygen Delivery Method: Circle system utilized Preoxygenation: Pre-oxygenation with 100% oxygen Intubation Type: IV induction Ventilation: Mask ventilation without difficulty and Oral airway inserted - appropriate to patient size Laryngoscope Size: Glidescope and 4 Grade View: Grade I Tube type: Oral Tube size: 7.5 mm Number of attempts: 3 (1 x [place], 2 x PC) Airway Equipment and Method: Patient positioned with wedge pillow and Video-laryngoscopy (Pt ventilated w/ 100%fiO2 between each attempt. Glidescope LoPro4 used on 3rd attempt with success) Placement Confirmation: ETT inserted through vocal cords under direct vision,  positive ETCO2 and breath sounds checked- equal and bilateral Secured at: 24 cm Tube secured with: Tape Dental Injury: Injury to lip and Teeth and Oropharynx as per pre-operative assessment  Difficulty Due To: Difficult Airway- due to anterior larynx and Difficulty was anticipated Future Recommendations: Recommend- induction with short-acting agent, and alternative techniques readily available

## 2014-09-09 NOTE — Op Note (Signed)
NAME:  Jeremy Kane, Jeremy Kane              ACCOUNT NO.:  [id number]  MEDICAL RECORD NO.:  [id number]  LOCATION:  [facility]                         FACILITY:  [facility]  PHYSICIAN:  Margaretmary Eddy, MD        DATE OF BIRTH:  1940/12/17  DATE OF PROCEDURE:  09/08/2014 DATE OF DISCHARGE:  09/08/2014                              OPERATIVE REPORT   PREOPERATIVE DIAGNOSIS:  Olecranon bursitis, left elbow.  POSTOPERATIVE DIAGNOSIS:  Olecranon bursitis, left elbow.  PROCEDURE:  Excision of olecranon bursa, left elbow.  SURGEON:  Christophe Louis, MD  ANESTHESIA:  General.  COMPLICATIONS:  None.  DRAINS:  One vessel loop drain.  TOURNIQUET TIME:  Approximately 35 minutes.  DESCRIPTION OF PROCEDURE:  A 2 g of Ancef was given intravenously prior to procedure.  General anesthesia was induced.  The left upper extremity was thoroughly prepped with alcohol and ChloraPrep and draped in standard sterile fashion.  The extremity was wrapped out with the Esmarch bandage and pneumatic tourniquet elevated to 250 mmHg.  Under loupe magnification, curved lateral incision was made at the lateral aspect of the swollen olecranon bursa.  The dissection was carefully carried down to the bursa.  Hemostasis was maintained at all times with the bipolar cautery.  The bursa sac is completely dissected out under loupe magnification.  Particular care was paid to the medial aspect and the ulnar nerve.  The rongeur was used to remove any remaining pieces of bursa. The wound was thoroughly irrigated multiple times.  Skin edges are infiltrated with 0.5% plain Marcaine.  Several subcutaneous 3-0 Vicryl sutures are placed.  The skin was closed with the skin stapler. Prior to this, 2 vessel loop drains are brought out through separate skin incisions and secured with a 3-0 Vicryl suture.  Soft bulky dressing was applied.  Tourniquet was released.  The patient was returned to the recovery room in satisfactory condition having  tolerated the procedure quite well.          ______________________________ Margaretmary Eddy, MD    CS/MEDQ  D:  09/09/2014  T:  09/09/2014  Job:  [id number]

## 2014-09-11 LAB — SURGICAL PATHOLOGY

## 2014-10-04 ENCOUNTER — Encounter: Payer: Self-pay | Admitting: Family Medicine

## 2014-10-04 ENCOUNTER — Ambulatory Visit (INDEPENDENT_AMBULATORY_CARE_PROVIDER_SITE_OTHER): Payer: Medicare Other | Admitting: Family Medicine

## 2014-10-04 VITALS — BP 102/58 | HR 66 | Temp 98.9°F | Resp 16 | Wt 213.0 lb

## 2014-10-04 DIAGNOSIS — I10 Essential (primary) hypertension: Secondary | ICD-10-CM

## 2014-10-04 DIAGNOSIS — I480 Paroxysmal atrial fibrillation: Secondary | ICD-10-CM

## 2014-10-04 NOTE — Progress Notes (Signed)
Patient ID: Jeremy Kane, male   DOB: 1941/02/04, 74 y.o.   MRN: [id number]    Subjective:  HPI  Hypertension, follow-up:  BP Readings from Last 3 Encounters:  10/04/14 102/58  09/08/14 113/77  09/05/14 100/69    He was last seen for hypertension 6 months ago.  BP at that visit was 100/68. Management changes since that visit include none. He reports good compliance with treatment. He is not having side effects.  Outside blood pressures are not being checked.    Weight trend: stable Wt Readings from Last 3 Encounters:  10/04/14 213 lb (96.616 kg)  09/08/14 214 lb (97.07 kg)  09/05/14 214 lb (97.07 kg)    ------------------------------------------------------------------------  Pt reports that he recently lost his wife suddenly about a month ago. He ws tearful when talking about it. He reports that he is grieving quite a bit. His preacher at church has given him some names of people to help with this but he has not found the time to do it, because of all the things he has to do since she passed.     Prior to Admission medications   Medication Sig Start Date End Date Taking? Authorizing Provider  dabigatran (PRADAXA) 150 MG CAPS Take 150 mg by mouth 2 (two) times daily.   Yes Historical Provider, MD  dofetilide (TIKOSYN) 500 MCG capsule Take 500 mcg by mouth 2 (two) times daily.   Yes Historical Provider, MD  latanoprost (XALATAN) 0.005 % ophthalmic solution Place 1 drop into both eyes daily.   Yes Historical Provider, MD  lisinopril (PRINIVIL,ZESTRIL) 10 MG tablet Take 10 mg by mouth daily.   Yes Historical Provider, MD  Magnesium 400 MG CAPS Take 1 capsule by mouth 2 (two) times daily.   Yes Historical Provider, MD  metoprolol tartrate (LOPRESSOR) 25 MG tablet Take 25 mg by mouth 2 (two) times daily.   Yes Historical Provider, MD  Psyllium 48.57 % POWD Take by mouth. 08/26/11  Yes Historical Provider, MD    Patient Active Problem List   Diagnosis Date Noted  .  Absolute anemia 08/07/2014  . AA (aortic aneurysm) 08/07/2014  . Colon polyp 08/07/2014  . HLD (hyperlipidemia) 08/07/2014  . BP (high blood pressure) 08/07/2014  . Change in blood platelet count 08/07/2014  . Disease of tricuspid valve 08/07/2014  . Diabetes mellitus, type 2 08/07/2014  . Idiopathic ventricular tachycardia 06/20/2014  . Obstructive apnea 05/03/2014  . Combined fat and carbohydrate induced hyperlipemia 01/17/2014  . Umbilical hernia 71/69/6789  . Atrial fibrillation 10/05/2012  . Ventral hernia 10/04/2012  . AI (aortic incompetence) 08/11/2011  . History of major vascular surgery 08/11/2011  . TI (tricuspid incompetence) 08/11/2011  . Artificial cardiac pacemaker 07/17/2011  . Diabetes 01/02/2011  . AF (paroxysmal atrial fibrillation) 01/02/2011    Past Medical History  Diagnosis Date  . A-fib   . Atrial fibrillation 10/05/2012  . Dysrhythmia     History   Social History  . Marital Status: Married    Spouse Name: N/A  . Number of Children: N/A  . Years of Education: N/A   Occupational History  . Not on file.   Social History Main Topics  . Smoking status: Former Smoker -- 1.00 packs/day for 19 years  . Smokeless tobacco: Never Used  . Alcohol Use: Yes  . Drug Use: No  . Sexual Activity: Not on file   Other Topics Concern  . Not on file   Social History Narrative    Allergies  Allergen Reactions  . Sulfa Antibiotics     Unsure of reaction as allergy happened as a child  . Coumadin [Warfarin Sodium] Rash    Review of Systems  Constitutional: Negative.   HENT: Negative.   Eyes: Negative.   Respiratory: Negative.   Cardiovascular: Negative.   Gastrointestinal: Negative.   Genitourinary: Negative.   Musculoskeletal: Negative.   Skin: Negative.   Neurological: Negative.   Endo/Heme/Allergies: Negative.   Psychiatric/Behavioral:       Tearful. Grieving recent death of wife.     Immunization History  Administered Date(s)  Administered  . Pneumococcal Conjugate-13 03/30/2014  . Pneumococcal Polysaccharide-23 05/04/2001, 06/11/2007  . Td 05/26/2006  . Zoster 04/13/2006, 07/21/2012   Objective:  BP 102/58 mmHg  Pulse 66  Temp(Src) 98.9 F (37.2 C) (Oral)  Resp 16  Wt 213 lb (96.616 kg)  Physical Exam  Constitutional: He is oriented to person, place, and time and well-developed, well-nourished, and in no distress.  HENT:  Head: Normocephalic and atraumatic.  Nose: Nose normal.  Eyes: Conjunctivae are normal. Pupils are equal, round, and reactive to light.  Neck: Normal range of motion.  Cardiovascular: Normal rate, regular rhythm and normal heart sounds.   Pulmonary/Chest: Effort normal and breath sounds normal.  Musculoskeletal:  5 in scar on left elbow from removal of left olecranon bursa. This is healing well.  Neurological: He is alert and oriented to person, place, and time.  Skin: Skin is warm and dry.  Psychiatric: Mood, memory, affect and judgment normal.    Lab Results  Component Value Date   WBC 5.6 09/05/2014   HGB 15.4 09/05/2014   HCT 46.3 09/05/2014   PLT 107* 09/05/2014   GLUCOSE 100* 10/13/2012   CHOL 190 03/31/2014   TRIG 79 03/31/2014   HDL 63 03/31/2014   LDLCALC 111 03/31/2014   TSH 1.79 03/31/2014   PSA 0.4 03/31/2014   HGBA1C 6.2* 03/31/2014    CMP     Component Value Date/Time   NA 144 03/31/2014   NA 143 10/13/2012 1035   NA 140 07/07/2008 1030   K 4.5 03/31/2014   K 4.4 10/13/2012 1035   CL 110* 10/13/2012 1035   CL 104 07/07/2008 1030   CO2 28 10/13/2012 1035   CO2 30 07/07/2008 1030   GLUCOSE 100* 10/13/2012 1035   GLUCOSE 94 07/07/2008 1030   BUN 13 03/31/2014   BUN 18 10/13/2012 1035   BUN 12 07/07/2008 1030   CREATININE 1.0 03/31/2014   CREATININE 0.96 10/13/2012 1035   CREATININE 0.89 07/07/2008 1030   CALCIUM 8.9 10/13/2012 1035   CALCIUM 9.7 07/07/2008 1030   PROT 7.0 07/07/2008 1030   ALBUMIN 4.1 07/07/2008 1030   AST 83* 03/31/2014    ALT 86* 03/31/2014   ALKPHOS 64 03/31/2014   BILITOT 0.8 07/07/2008 1030   GFRNONAA >60 10/13/2012 1035   GFRNONAA >60 07/07/2008 1030   GFRAA >60 10/13/2012 1035   GFRAA  07/07/2008 1030    >60        The eGFR has been calculated using the MDRD equation. This calculation has not been validated in all clinical situations. eGFR's persistently <60 mL/min signify possible Chronic Kidney Disease.    Assessment and Plan :   hypertension CAD Recent removal of left olecranon bursa for chronic bursitis Appropriate mourning with sudden death of his wife last month.  Miguel Aschoff MD [facility] 10/04/2014 11:29 AM

## 2014-11-16 ENCOUNTER — Other Ambulatory Visit: Payer: Self-pay | Admitting: Family Medicine

## 2014-12-05 DIAGNOSIS — I480 Paroxysmal atrial fibrillation: Secondary | ICD-10-CM | POA: Diagnosis not present

## 2014-12-05 DIAGNOSIS — Z79899 Other long term (current) drug therapy: Secondary | ICD-10-CM | POA: Diagnosis not present

## 2014-12-05 DIAGNOSIS — I4581 Long QT syndrome: Secondary | ICD-10-CM | POA: Diagnosis not present

## 2014-12-05 DIAGNOSIS — Z45018 Encounter for adjustment and management of other part of cardiac pacemaker: Secondary | ICD-10-CM | POA: Diagnosis not present

## 2014-12-05 DIAGNOSIS — Z7901 Long term (current) use of anticoagulants: Secondary | ICD-10-CM | POA: Diagnosis not present

## 2014-12-13 DIAGNOSIS — H4011X2 Primary open-angle glaucoma, moderate stage: Secondary | ICD-10-CM | POA: Diagnosis not present

## 2014-12-19 DIAGNOSIS — I361 Nonrheumatic tricuspid (valve) insufficiency: Secondary | ICD-10-CM | POA: Diagnosis not present

## 2014-12-19 DIAGNOSIS — G4733 Obstructive sleep apnea (adult) (pediatric): Secondary | ICD-10-CM | POA: Diagnosis not present

## 2014-12-19 DIAGNOSIS — I482 Chronic atrial fibrillation: Secondary | ICD-10-CM | POA: Diagnosis not present

## 2014-12-19 DIAGNOSIS — E782 Mixed hyperlipidemia: Secondary | ICD-10-CM | POA: Diagnosis not present

## 2014-12-19 DIAGNOSIS — I351 Nonrheumatic aortic (valve) insufficiency: Secondary | ICD-10-CM | POA: Diagnosis not present

## 2014-12-26 DIAGNOSIS — Z23 Encounter for immunization: Secondary | ICD-10-CM | POA: Diagnosis not present

## 2015-02-05 ENCOUNTER — Ambulatory Visit (INDEPENDENT_AMBULATORY_CARE_PROVIDER_SITE_OTHER): Payer: Medicare Other | Admitting: Family Medicine

## 2015-02-05 ENCOUNTER — Encounter: Payer: Self-pay | Admitting: Family Medicine

## 2015-02-05 VITALS — BP 104/62 | HR 74 | Temp 98.2°F | Resp 16 | Wt 211.0 lb

## 2015-02-05 DIAGNOSIS — E119 Type 2 diabetes mellitus without complications: Secondary | ICD-10-CM

## 2015-02-05 LAB — POCT GLYCOSYLATED HEMOGLOBIN (HGB A1C): HEMOGLOBIN A1C: 5.6

## 2015-02-05 NOTE — Progress Notes (Signed)
Patient ID: Jeremy Kane, male   DOB: 1940/11/15, 74 y.o.   MRN: [id number]     Subjective:  HPI   Pt reports that he is doing better with grieving his wife's death. He is still seeing and talking with people at church. He reports that he is doing better. He has been seeing his neighbor, taking her out to eat and spending time with her, as he does not like to sit at home alone.    Diabetes Mellitus Type II, Follow-up:   Lab Results  Component Value Date   HGBA1C 6.2* 03/31/2014    Last seen for diabetes 4 months ago.  Management since then includes none. He reports good compliance with treatment. He is not having side effects.  Current symptoms include none. Home blood sugar records: not being checked.  Episodes of hypoglycemia?     Pertinent Labs:    Component Value Date/Time   CHOL 190 03/31/2014   TRIG 79 03/31/2014   CREATININE 1.0 03/31/2014   CREATININE 0.96 10/13/2012 1035   CREATININE 0.89 07/07/2008 1030    Wt Readings from Last 3 Encounters:  02/05/15 211 lb (95.709 kg)  10/04/14 213 lb (96.616 kg)  09/08/14 214 lb (97.07 kg)    ------------------------------------------------------------------------    Hypertension, follow-up:  BP Readings from Last 3 Encounters:  02/05/15 104/62  10/04/14 102/58  09/08/14 113/77    He was last seen for hypertension 4 months ago.  BP at that visit was 102/58. Management since that visit includes none. He reports good compliance with treatment. He is not having side effects.  He is exercising. 5-6 days a week. Outside blood pressures are not being checked. He is experiencing none.  Patient denies chest pain, chest pressure/discomfort, dyspnea, exertional chest pressure/discomfort, irregular heart beat and palpitations.   Cardiovascular risk factors include advanced age (older than 8 for men, 24 for women), diabetes mellitus, dyslipidemia, hypertension, male gender and smoking/ tobacco exposure.     Wt  Readings from Last 3 Encounters:  02/05/15 211 lb (95.709 kg)  10/04/14 213 lb (96.616 kg)  09/08/14 214 lb (97.07 kg)   ------------------------------------------------------------------------  Pt had flu vaccine about 2 months ago at his pharmacy.   Prior to Admission medications   Medication Sig Start Date End Date Taking? Authorizing Provider  dabigatran (PRADAXA) 150 MG CAPS Take 150 mg by mouth 2 (two) times daily.   Yes Historical Provider, MD  dofetilide (TIKOSYN) 500 MCG capsule Take 500 mcg by mouth 2 (two) times daily.   Yes Historical Provider, MD  latanoprost (XALATAN) 0.005 % ophthalmic solution Place 1 drop into both eyes daily.   Yes Historical Provider, MD  lisinopril (PRINIVIL,ZESTRIL) 10 MG tablet TAKE 1 TABLET EVERY DAY 11/16/14  Yes Jerrol Banana., MD  Magnesium 400 MG CAPS Take 1 capsule by mouth 2 (two) times daily.   Yes Historical Provider, MD  metoprolol tartrate (LOPRESSOR) 25 MG tablet Take 25 mg by mouth 2 (two) times daily.   Yes Historical Provider, MD  Psyllium 48.57 % POWD Take by mouth. 08/26/11  Yes Historical Provider, MD    Patient Active Problem List   Diagnosis Date Noted  . Absolute anemia 08/07/2014  . AA (aortic aneurysm) ([place]) 08/07/2014  . Colon polyp 08/07/2014  . HLD (hyperlipidemia) 08/07/2014  . BP (high blood pressure) 08/07/2014  . Change in blood platelet count 08/07/2014  . Disease of tricuspid valve 08/07/2014  . Diabetes mellitus, type 2 ([place]) 08/07/2014  . Idiopathic ventricular  tachycardia ([place]) 06/20/2014  . Ventricular tachycardia ([place]) 06/20/2014  . Obstructive apnea 05/03/2014  . Combined fat and carbohydrate induced hyperlipemia 01/17/2014  . Umbilical hernia 77/82/4235  . Atrial fibrillation ([place]) 10/05/2012  . Ventral hernia 10/04/2012  . AI (aortic incompetence) 08/11/2011  . History of major vascular surgery 08/11/2011  . TI (tricuspid incompetence) 08/11/2011  . Artificial cardiac pacemaker 07/17/2011  .  Diabetes ([place]) 01/02/2011  . AF (paroxysmal atrial fibrillation) ([place]) 01/02/2011    Past Medical History  Diagnosis Date  . A-fib ([place])   . Atrial fibrillation ([place]) 10/05/2012  . Dysrhythmia     Social History   Social History  . Marital Status: Married    Spouse Name: N/A  . Number of Children: N/A  . Years of Education: N/A   Occupational History  . Not on file.   Social History Main Topics  . Smoking status: Former Smoker -- 1.00 packs/day for 19 years  . Smokeless tobacco: Never Used  . Alcohol Use: Yes     Comment: scotch every night  . Drug Use: No  . Sexual Activity: Not on file   Other Topics Concern  . Not on file   Social History Narrative    Allergies  Allergen Reactions  . Sulfa Antibiotics     Unsure of reaction as allergy happened as a child  . Coumadin [Warfarin Sodium] Rash    Review of Systems  Constitutional: Negative.   HENT: Negative.   Eyes: Negative.   Respiratory: Negative.   Cardiovascular: Negative.   Gastrointestinal: Negative.   Genitourinary: Negative.   Musculoskeletal: Negative.   Skin: Negative.   Neurological: Negative.   Endo/Heme/Allergies: Negative.   Psychiatric/Behavioral: Negative.     Immunization History  Administered Date(s) Administered  . Pneumococcal Conjugate-13 03/30/2014  . Pneumococcal Polysaccharide-23 05/04/2001, 06/11/2007  . Td 05/26/2006  . Zoster 04/13/2006, 07/21/2012   Objective:  BP 104/62 mmHg  Pulse 74  Temp(Src) 98.2 F (36.8 C) (Oral)  Resp 16  Wt 211 lb (95.709 kg)  Physical Exam  Constitutional: He is oriented to person, place, and time and well-developed, well-nourished, and in no distress.  HENT:  Head: Normocephalic and atraumatic.  Right Ear: External ear normal.  Left Ear: External ear normal.  Nose: Nose normal.  Eyes: Conjunctivae are normal.  Neck: Neck supple.  Cardiovascular: Normal rate, regular rhythm and normal heart sounds.   Pulmonary/Chest: Effort normal  and breath sounds normal.  Abdominal: Soft.  Neurological: He is alert and oriented to person, place, and time.  Skin: Skin is warm and dry.  Psychiatric: Mood, memory, affect and judgment normal.    Lab Results  Component Value Date   WBC 5.6 09/05/2014   HGB 15.4 09/05/2014   HCT 46.3 09/05/2014   PLT 107* 09/05/2014   GLUCOSE 100* 10/13/2012   CHOL 190 03/31/2014   TRIG 79 03/31/2014   HDL 63 03/31/2014   LDLCALC 111 03/31/2014   TSH 1.79 03/31/2014   PSA 0.4 03/31/2014   HGBA1C 6.2* 03/31/2014    CMP     Component Value Date/Time   NA 144 03/31/2014   NA 143 10/13/2012 1035   NA 140 07/07/2008 1030   K 4.5 03/31/2014   K 4.4 10/13/2012 1035   CL 110* 10/13/2012 1035   CL 104 07/07/2008 1030   CO2 28 10/13/2012 1035   CO2 30 07/07/2008 1030   GLUCOSE 100* 10/13/2012 1035   GLUCOSE 94 07/07/2008 1030   BUN 13 03/31/2014  BUN 18 10/13/2012 1035   BUN 12 07/07/2008 1030   CREATININE 1.0 03/31/2014   CREATININE 0.96 10/13/2012 1035   CREATININE 0.89 07/07/2008 1030   CALCIUM 8.9 10/13/2012 1035   CALCIUM 9.7 07/07/2008 1030   PROT 7.0 07/07/2008 1030   ALBUMIN 4.1 07/07/2008 1030   AST 83* 03/31/2014   ALT 86* 03/31/2014   ALKPHOS 64 03/31/2014   BILITOT 0.8 07/07/2008 1030   GFRNONAA >60 10/13/2012 1035   GFRNONAA >60 07/07/2008 1030   GFRAA >60 10/13/2012 1035   GFRAA  07/07/2008 1030    >60        The eGFR has been calculated using the MDRD equation. This calculation has not been validated in all clinical situations. eGFR's persistently <60 mL/min signify possible Chronic Kidney Disease.    Assessment and Plan :  1. Type 2 diabetes mellitus without complication, without long-term current use of insulin (HCC)  - POCT HgB A1C--5.6 today.  2. Hypertension  3. Atrial fibrillation  4. Aortic aneurysm  followed by cardiology.  5. Adjustment reaction  Patient appropriately mourning the recent sudden death of his wife Miguel Aschoff  MD [facility] 02/05/2015 12:02 PM

## 2015-02-08 DIAGNOSIS — I8312 Varicose veins of left lower extremity with inflammation: Secondary | ICD-10-CM | POA: Diagnosis not present

## 2015-02-08 DIAGNOSIS — L812 Freckles: Secondary | ICD-10-CM | POA: Diagnosis not present

## 2015-02-08 DIAGNOSIS — Z85828 Personal history of other malignant neoplasm of skin: Secondary | ICD-10-CM | POA: Diagnosis not present

## 2015-02-08 DIAGNOSIS — L820 Inflamed seborrheic keratosis: Secondary | ICD-10-CM | POA: Diagnosis not present

## 2015-02-08 DIAGNOSIS — Z1283 Encounter for screening for malignant neoplasm of skin: Secondary | ICD-10-CM | POA: Diagnosis not present

## 2015-02-08 DIAGNOSIS — L821 Other seborrheic keratosis: Secondary | ICD-10-CM | POA: Diagnosis not present

## 2015-02-08 DIAGNOSIS — D485 Neoplasm of uncertain behavior of skin: Secondary | ICD-10-CM | POA: Diagnosis not present

## 2015-02-08 DIAGNOSIS — D1800 Hemangioma unspecified site: Secondary | ICD-10-CM | POA: Diagnosis not present

## 2015-02-08 DIAGNOSIS — D229 Melanocytic nevi, unspecified: Secondary | ICD-10-CM | POA: Diagnosis not present

## 2015-02-08 DIAGNOSIS — L578 Other skin changes due to chronic exposure to nonionizing radiation: Secondary | ICD-10-CM | POA: Diagnosis not present

## 2015-02-08 DIAGNOSIS — I8311 Varicose veins of right lower extremity with inflammation: Secondary | ICD-10-CM | POA: Diagnosis not present

## 2015-02-13 ENCOUNTER — Telehealth: Payer: Self-pay | Admitting: Family Medicine

## 2015-02-13 NOTE — Telephone Encounter (Signed)
Dr. Rosanna Randy This is not on his list of meds.  Are you ok with it? Thanks

## 2015-02-13 NOTE — Telephone Encounter (Signed)
Pt called for refill generic viagra .Marland Kitchen [place]  Call back is [phone]  Thanks Con Memos

## 2015-02-13 NOTE — Telephone Encounter (Signed)
Yes--sildenafil 290mg  2-4 daily prn,# q day prn,#100,12 rf.

## 2015-02-14 ENCOUNTER — Other Ambulatory Visit: Payer: Self-pay

## 2015-02-14 MED ORDER — SILDENAFIL CITRATE 20 MG PO TABS: 20.0000 mg | ORAL_TABLET | Freq: Three times a day (TID) | ORAL | Status: DC

## 2015-03-07 DIAGNOSIS — I480 Paroxysmal atrial fibrillation: Secondary | ICD-10-CM | POA: Diagnosis not present

## 2015-03-07 DIAGNOSIS — R0602 Shortness of breath: Secondary | ICD-10-CM | POA: Diagnosis not present

## 2015-03-07 DIAGNOSIS — I482 Chronic atrial fibrillation: Secondary | ICD-10-CM | POA: Diagnosis not present

## 2015-03-07 DIAGNOSIS — E782 Mixed hyperlipidemia: Secondary | ICD-10-CM | POA: Diagnosis not present

## 2015-03-13 DIAGNOSIS — I480 Paroxysmal atrial fibrillation: Secondary | ICD-10-CM | POA: Diagnosis not present

## 2015-03-26 ENCOUNTER — Emergency Department
Admission: EM | Admit: 2015-03-26 | Discharge: 2015-03-26 | Disposition: A | Discharge status: Home / Self Care | Payer: Medicare Other

## 2015-03-26 ENCOUNTER — Emergency Department
Admission: EM | Admit: 2015-03-26 | Discharge: 2015-03-26 | Disposition: A | Discharge status: Home / Self Care | Payer: Medicare Other | Attending: Emergency Medicine | Admitting: Emergency Medicine

## 2015-03-26 ENCOUNTER — Encounter: Payer: Self-pay | Admitting: Emergency Medicine

## 2015-03-26 DIAGNOSIS — E119 Type 2 diabetes mellitus without complications: Secondary | ICD-10-CM | POA: Diagnosis not present

## 2015-03-26 DIAGNOSIS — Z79899 Other long term (current) drug therapy: Secondary | ICD-10-CM | POA: Diagnosis not present

## 2015-03-26 DIAGNOSIS — Z87891 Personal history of nicotine dependence: Secondary | ICD-10-CM | POA: Insufficient documentation

## 2015-03-26 DIAGNOSIS — L988 Other specified disorders of the skin and subcutaneous tissue: Secondary | ICD-10-CM | POA: Diagnosis present

## 2015-03-26 DIAGNOSIS — L03312 Cellulitis of back [any part except buttock]: Secondary | ICD-10-CM | POA: Insufficient documentation

## 2015-03-26 MED ORDER — CEPHALEXIN 500 MG PO CAPS: 500.0000 mg | ORAL_CAPSULE | Freq: Four times a day (QID) | ORAL | Status: DC

## 2015-03-26 NOTE — ED Provider Notes (Signed)
[facility] Emergency Department Provider Note ?  ? ____________________________________________ ? Time seen: 3:21 PM ? I have reviewed the triage vital signs and the nursing notes.  ________ HISTORY ? Chief Complaint Abscess     HPI  Jeremy Kane is a 75 y.o. male   who presents to the emergency department complaining of a busted" skin lesion" to the lower left back. Patient states that he was seen by dermatologist a month ago and had a little "red spot" there. Per the patient and radiologist was not concerned and no treatment was provided. The patient states that it enlarged over the weekend and then "burst". Patient states that area is slightly tender to palpation. He has noticed no purulent drainage. No fevers or chills, no abdominal pain, no nausea or vomiting. ? ? ? Past Medical History  Diagnosis Date  . A-fib ([place])   . Atrial fibrillation ([place]) 10/05/2012  . Dysrhythmia     Patient Active Problem List   Diagnosis Date Noted  . Absolute anemia 08/07/2014  . AA (aortic aneurysm) ([place]) 08/07/2014  . Colon polyp 08/07/2014  . HLD (hyperlipidemia) 08/07/2014  . BP (high blood pressure) 08/07/2014  . Change in blood platelet count 08/07/2014  . Disease of tricuspid valve 08/07/2014  . Diabetes mellitus, type 2 ([place]) 08/07/2014  . Idiopathic ventricular tachycardia ([place]) 06/20/2014  . Ventricular tachycardia ([place]) 06/20/2014  . Obstructive apnea 05/03/2014  . Combined fat and carbohydrate induced hyperlipemia 01/17/2014  . Umbilical hernia [id number]  . Atrial fibrillation ([place]) 10/05/2012  . Ventral hernia 10/04/2012  . AI (aortic incompetence) 08/11/2011  . History of major vascular surgery 08/11/2011  . TI (tricuspid incompetence) 08/11/2011  . Artificial cardiac pacemaker 07/17/2011  . Diabetes ([place]) 01/02/2011  . AF (paroxysmal atrial fibrillation) ([place]) 01/02/2011   ? Past Surgical History  Procedure Laterality Date  . Cardiac  surgery  2013  . Pacemaker insertion  2013  . Hernia repair  123456    umbilical  . Anal fissure repair    . Cataract extraction    . Tonsillectomy    . Aortic valve repair    . Olecranon bursectomy Left 09/08/2014    Procedure: OLECRANON BURSA;  Surgeon: Christophe Louis, MD;  Location: [facility] ORS;  Service: Orthopedics;  Laterality: Left;   ? Current Outpatient Rx  Name  Route  Sig  Dispense  Refill  . cephALEXin (KEFLEX) 500 MG capsule   Oral   Take 1 capsule (500 mg total) by mouth 4 (four) times daily.   28 capsule   0   . dabigatran (PRADAXA) 150 MG CAPS   Oral   Take 150 mg by mouth 2 (two) times daily.         Marland Kitchen dofetilide (TIKOSYN) 500 MCG capsule   Oral   Take 500 mcg by mouth 2 (two) times daily.         Marland Kitchen latanoprost (XALATAN) 0.005 % ophthalmic solution   Both Eyes   Place 1 drop into both eyes daily.         Marland Kitchen lisinopril (PRINIVIL,ZESTRIL) 10 MG tablet      TAKE 1 TABLET EVERY DAY   30 tablet   12   . Magnesium 400 MG CAPS   Oral   Take 1 capsule by mouth 2 (two) times daily.         . metoprolol tartrate (LOPRESSOR) 25 MG tablet   Oral   Take 25 mg by mouth 2 (two) times daily.         Marland Kitchen  Psyllium 48.57 % POWD   Oral   Take by mouth.         . sildenafil (REVATIO) 20 MG tablet   Oral   Take 1 tablet (20 mg total) by mouth 3 (three) times daily.   10 tablet   12    ? Allergies Sulfa antibiotics and Coumadin ? Family History  Problem Relation Age of Onset  . Alzheimer's disease Mother   . Heart disease Mother   . Alcohol abuse Father   . Prostate cancer Father   . HIV/AIDS Brother    ? Social History Social History  Substance Use Topics  . Smoking status: Former Smoker -- 1.00 packs/day for 19 years  . Smokeless tobacco: Never Used  . Alcohol Use: Yes     Comment: scotch every night   ? Review of Systems Constitutional: no fever. Eyes: no discharge ENT: no sore throat. Cardiovascular: no chest pain. Respiratory:  no cough. No sob Gastrointestinal: denies abdominal pain, vomiting, diarrhea, and constipation Genitourinary: no dysuria. Negative for hematuria Musculoskeletal: Negative for back pain. Skin: Negative for rash. Endorses busted skin lesion. Neurological: Negative for headaches  10-point ROS otherwise negative.  _______________ PHYSICAL EXAM: ? VITAL SIGNS:   ED Triage Vitals  Enc Vitals Group     BP 03/26/15 1435 107/83 mmHg     Pulse Rate 03/26/15 1435 76     Resp 03/26/15 1435 18     Temp 03/26/15 1435 97.7 F (36.5 C)     Temp Source 03/26/15 1435 Oral     SpO2 03/26/15 1435 100 %     Weight 03/26/15 1430 211 lb (95.709 kg)     Height 03/26/15 1435 6\' 2"  (1.88 m)     Head Cir --      Peak Flow --      Pain Score --      Pain Loc --      Pain Edu? --      Excl. in [place]? --    ?  Constitutional: Alert and oriented. Well appearing and in no distress. Eyes: Conjunctivae are normal.  ENT      Head: Normocephalic and atraumatic.      Ears:       Nose: No congestion/rhinnorhea.      Mouth/Throat: Mucous membranes are moist.   Hematological/Lymphatic/Immunilogical: No cervical lymphadenopathy. Cardiovascular: Normal rate, regular rhythm. Normal S1 and S2. Respiratory: Normal respiratory effort without tachypnea nor retractions. Lungs CTAB. Gastrointestinal: Soft and nontender. No distention. There is no CVA tenderness. Genitourinary:  Musculoskeletal: Nontender with normal range of motion in all extremities.  Neurologic:  Normal speech and language. No gross focal neurologic deficits are appreciated. Skin:  Skin is warm, dry and intact. No rash noted. circular lesion noted to the left mid back area. Epidermal layer appears to have blistered and then ruptured. Minor surrounding erythema and edema. Area is mildly tender to palpation. No fluctuance noted.  Psychiatric: Mood and affect are normal. Speech and behavior are normal. Patient exhibits appropriate insight and  judgment.    LABS (all labs ordered are listed, but only abnormal results are displayed)  Labs Reviewed - No data to display  ___________ RADIOLOGY    _____________ PROCEDURES ? Procedure(s) performed:    Medications - No data to display  ______________________________________________________ INITIAL IMPRESSION / ASSESSMENT AND PLAN / ED COURSE ? Pertinent labs & imaging results that were available during my care of the patient were reviewed by me and considered in my medical decision  making (see chart for details).   The patient's diagnosis is consistent with cellulitis. This is likely from a ruptured blistered area in the lower back. There is no signs of abscess. Patient will be treated with antibiotics and advised to keep area clean and covered. Patient will follow-up with dermatologist or primary care for symptoms persisting past antibiotic use.     New Prescriptions   CEPHALEXIN (KEFLEX) 500 MG CAPSULE    Take 1 capsule (500 mg total) by mouth 4 (four) times daily.   ____________________________________________ FINAL CLINICAL IMPRESSION(S) / ED DIAGNOSES?  Final diagnoses:  Cellulitis of back except buttock            Darletta Moll, PA-C 03/26/15 1521  Daymon Larsen, MD 03/26/15 1550

## 2015-03-26 NOTE — ED Notes (Signed)
States he had an abscess area to lower back for several months  Area became large and "busted" this weekend

## 2015-03-26 NOTE — ED Notes (Signed)
Small area noted to left lower back  No drainage noted at present

## 2015-03-26 NOTE — Discharge Instructions (Signed)

## 2015-03-29 DIAGNOSIS — L720 Epidermal cyst: Secondary | ICD-10-CM | POA: Diagnosis not present

## 2015-03-29 DIAGNOSIS — L0390 Cellulitis, unspecified: Secondary | ICD-10-CM | POA: Diagnosis not present

## 2015-04-11 ENCOUNTER — Encounter: Payer: Self-pay | Admitting: Family Medicine

## 2015-04-11 ENCOUNTER — Ambulatory Visit (INDEPENDENT_AMBULATORY_CARE_PROVIDER_SITE_OTHER): Payer: Medicare Other | Admitting: Family Medicine

## 2015-04-11 VITALS — BP 102/76 | HR 64 | Temp 97.7°F | Resp 14 | Ht 72.0 in | Wt 209.0 lb

## 2015-04-11 DIAGNOSIS — L0390 Cellulitis, unspecified: Secondary | ICD-10-CM | POA: Diagnosis not present

## 2015-04-11 DIAGNOSIS — R7303 Prediabetes: Secondary | ICD-10-CM | POA: Diagnosis not present

## 2015-04-11 DIAGNOSIS — R5383 Other fatigue: Secondary | ICD-10-CM

## 2015-04-11 DIAGNOSIS — I2510 Atherosclerotic heart disease of native coronary artery without angina pectoris: Secondary | ICD-10-CM | POA: Diagnosis not present

## 2015-04-11 DIAGNOSIS — I10 Essential (primary) hypertension: Secondary | ICD-10-CM | POA: Diagnosis not present

## 2015-04-11 DIAGNOSIS — E785 Hyperlipidemia, unspecified: Secondary | ICD-10-CM

## 2015-04-11 NOTE — Progress Notes (Signed)
Patient ID: Jeremy Kane, male   DOB: [birth year], 75 y.o.   MRN: [id number] Patient: Jeremy Kane, Male    DOB: 05/19/1940, 75 y.o.   MRN: [id number] Visit Date: 04/11/2015  Today's Provider: Wilhemena Durie, MD   Chief Complaint  Patient presents with  . Medicare Wellness   Subjective:  INFANTOF Jeremy Kane is a 75 y.o. male who presents today for health maintenance and complete physical. He feels well. He reports exercising 5-6 times per week. He reports he is sleeping well (average 8-9 hours per night).  Colonoscopy: 05/28/2010 repeat in 5 years  Review of Systems  Constitutional: Negative.   HENT: Negative.   Eyes: Negative.   Respiratory: Negative.   Cardiovascular: Negative.   Gastrointestinal: Negative.   Endocrine: Negative.   Genitourinary: Negative.   Musculoskeletal: Negative.   Skin: Positive for rash.  Allergic/Immunologic: Negative.   Neurological: Negative.   Hematological: Negative.   Psychiatric/Behavioral: Negative.     Social History   Social History  . Marital Status: Married    Spouse Name: N/A  . Number of Children: N/A  . Years of Education: N/A   Occupational History  . Not on file.   Social History Main Topics  . Smoking status: Former Smoker -- 1.00 packs/day for 19 years  . Smokeless tobacco: Never Used  . Alcohol Use: Yes     Comment: scotch every night  . Drug Use: No  . Sexual Activity: Not on file   Other Topics Concern  . Not on file   Social History Narrative    Patient Active Problem List   Diagnosis Date Noted  . Absolute anemia 08/07/2014  . AA (aortic aneurysm) ([place]) 08/07/2014  . Colon polyp 08/07/2014  . HLD (hyperlipidemia) 08/07/2014  . BP (high blood pressure) 08/07/2014  . Change in blood platelet count 08/07/2014  . Disease of tricuspid valve 08/07/2014  . Diabetes mellitus, type 2 ([place]) 08/07/2014  . Idiopathic ventricular tachycardia ([place]) 06/20/2014  . Ventricular tachycardia ([place]) 06/20/2014  .  Obstructive apnea 05/03/2014  . Combined fat and carbohydrate induced hyperlipemia 01/17/2014  . Umbilical hernia [id number]  . Atrial fibrillation ([place]) 10/05/2012  . Ventral hernia 10/04/2012  . AI (aortic incompetence) 08/11/2011  . History of major vascular surgery 08/11/2011  . TI (tricuspid incompetence) 08/11/2011  . Artificial cardiac pacemaker 07/17/2011  . Diabetes ([place]) 01/02/2011  . AF (paroxysmal atrial fibrillation) ([place]) 01/02/2011    Past Surgical History  Procedure Laterality Date  . Cardiac surgery  2013  . Pacemaker insertion  2013  . Hernia repair  123456    umbilical  . Anal fissure repair    . Cataract extraction    . Tonsillectomy    . Aortic valve repair    . Olecranon bursectomy Left 09/08/2014    Procedure: OLECRANON BURSA;  Surgeon: Christophe Louis, MD;  Location: [facility] ORS;  Service: Orthopedics;  Laterality: Left;    His family history includes Alcohol abuse in his father; Alzheimer's disease in his mother; HIV/AIDS in his brother; Heart disease in his mother; Prostate cancer in his father.    Outpatient Prescriptions Prior to Visit  Medication Sig Dispense Refill  . dabigatran (PRADAXA) 150 MG CAPS Take 150 mg by mouth 2 (two) times daily.    Marland Kitchen dofetilide (TIKOSYN) 500 MCG capsule Take 500 mcg by mouth 2 (two) times daily.    Marland Kitchen latanoprost (XALATAN) 0.005 % ophthalmic solution Place 1 drop into both eyes daily.    Marland Kitchen lisinopril (  PRINIVIL,ZESTRIL) 10 MG tablet TAKE 1 TABLET EVERY DAY 30 tablet 12  . Magnesium 400 MG CAPS Take 1 capsule by mouth 2 (two) times daily.    . metoprolol tartrate (LOPRESSOR) 25 MG tablet Take 25 mg by mouth 2 (two) times daily.    . Psyllium 48.57 % POWD Take by mouth.    . sildenafil (REVATIO) 20 MG tablet Take 1 tablet (20 mg total) by mouth 3 (three) times daily. 10 tablet 12  . cephALEXin (KEFLEX) 500 MG capsule Take 1 capsule (500 mg total) by mouth 4 (four) times daily. 28 capsule 0   No facility-administered  medications prior to visit.    Patient Care Team: Jerrol Banana., MD as PCP - General (Family Medicine) Robert Bellow, MD (General Surgery) Jerrol Banana., MD (Family Medicine)     Objective:  Cognitive Testing - 6-CIT  Correct? Score   What year is it? yes 0 0 or 4  What month is it? yes 0 0 or 3  Memorize:    Jeremy Kane,  40,  [place],      What time is it? (within 1 hour) yes 3 0 or 3  Count backwards from 20 yes 0 0, 2, or 4  Name the months of the year yes 0 0, 2, or 4  Repeat name & address above yes 3 0, 2, 4, 6, 8, or 10       TOTAL SCORE  6/28   Interpretation:  Normal  Normal (0-7) Abnormal (8-28)   Vitals:  Filed Vitals:   04/11/15 1408  BP: 102/76  Pulse: 64  Temp: 97.7 F (36.5 C)  TempSrc: Oral  Resp: 14  Height: 6' (1.829 m)  Weight: 209 lb (94.802 kg)    Physical Exam  Constitutional: He is oriented to person, place, and time. He appears well-developed and well-nourished.  HENT:  Head: Normocephalic and atraumatic.  Right Ear: External ear normal.  Left Ear: External ear normal.  Nose: Nose normal.  Eyes: Conjunctivae are normal.  Neck: Neck supple.  Cardiovascular: Normal rate, regular rhythm and normal heart sounds.   Pulmonary/Chest: Effort normal and breath sounds normal.  Abdominal: Soft.  Genitourinary: Rectum normal, prostate normal and penis normal.  Neurological: He is alert and oriented to person, place, and time.  Skin: Skin is warm and dry.  Psychiatric: He has a normal mood and affect. His behavior is normal. Judgment and thought content normal.     Depression Screen PHQ 2/9 Scores 04/11/2015  PHQ - 2 Score 0      Assessment & Plan:     Routine Health Maintenance and Physical Exam  Exercise Activities and Dietary recommendations Goals    None      Immunization History  Administered Date(s) Administered  . Pneumococcal Conjugate-13 03/30/2014  . Pneumococcal Polysaccharide-23  05/04/2001, 06/11/2007  . Td 05/26/2006  . Zoster 04/13/2006, 07/21/2012    Health Maintenance  Topic Date Due  . FOOT EXAM  02/14/1949  . OPHTHALMOLOGY EXAM  02/14/1949  . INFLUENZA VACCINE  02/05/2016 (Originally 10/23/2014)  . HEMOGLOBIN A1C  08/05/2015  . TETANUS/TDAP  05/25/2016  . ZOSTAVAX  Completed  . PNA vac Low Risk Adult  Completed     patient appropriately mourning the death of his wife from last year. Discussed health benefits of physical activity, and encouraged him to engage in regular exercise appropriate for his age and condition.   I have done the exam and  reviewed the above chart and it is accurate to the best of my knowledge.  ------------------------------------------------------------------------------------------------------------

## 2015-04-12 DIAGNOSIS — I10 Essential (primary) hypertension: Secondary | ICD-10-CM | POA: Diagnosis not present

## 2015-04-12 DIAGNOSIS — E785 Hyperlipidemia, unspecified: Secondary | ICD-10-CM | POA: Diagnosis not present

## 2015-04-12 DIAGNOSIS — L0390 Cellulitis, unspecified: Secondary | ICD-10-CM | POA: Diagnosis not present

## 2015-04-12 DIAGNOSIS — I2510 Atherosclerotic heart disease of native coronary artery without angina pectoris: Secondary | ICD-10-CM | POA: Diagnosis not present

## 2015-04-12 DIAGNOSIS — R7303 Prediabetes: Secondary | ICD-10-CM | POA: Diagnosis not present

## 2015-04-12 DIAGNOSIS — R5383 Other fatigue: Secondary | ICD-10-CM | POA: Diagnosis not present

## 2015-04-13 LAB — COMP. METABOLIC PANEL (12)
ALBUMIN: 4 g/dL (ref 3.5–4.8)
AST: 76 IU/L — ABNORMAL HIGH (ref 0–40)
Albumin/Globulin Ratio: 1.5 (ref 1.1–2.5)
Alkaline Phosphatase: 60 IU/L (ref 39–117)
BUN/Creatinine Ratio: 13 (ref 10–22)
BUN: 13 mg/dL (ref 8–27)
Bilirubin Total: 0.6 mg/dL (ref 0.0–1.2)
CALCIUM: 9.4 mg/dL (ref 8.6–10.2)
CREATININE: 0.98 mg/dL (ref 0.76–1.27)
Chloride: 103 mmol/L (ref 96–106)
GFR, EST AFRICAN AMERICAN: 86 mL/min/{1.73_m2} (ref >59)
GFR, EST NON AFRICAN AMERICAN: 75 mL/min/{1.73_m2} (ref >59)
Globulin, Total: 2.7 g/dL (ref 1.5–4.5)
Glucose: 90 mg/dL (ref 65–99)
Potassium: 4.4 mmol/L (ref 3.5–5.2)
SODIUM: 144 mmol/L (ref 134–144)
Total Protein: 6.7 g/dL (ref 6.0–8.5)

## 2015-04-13 LAB — CBC WITH DIFFERENTIAL
BASOS ABS: 0 10*3/uL (ref 0.0–0.2)
Basos: 1 %
EOS (ABSOLUTE): 0.1 10*3/uL (ref 0.0–0.4)
Eos: 3 %
Hematocrit: 45 % (ref 37.5–51.0)
Hemoglobin: 15 g/dL (ref 12.6–17.7)
Immature Grans (Abs): 0 10*3/uL (ref 0.0–0.1)
Immature Granulocytes: 0 %
LYMPHS: 42 %
Lymphocytes Absolute: 1.6 10*3/uL (ref 0.7–3.1)
MCH: 33.9 pg — ABNORMAL HIGH (ref 26.6–33.0)
MCHC: 33.3 g/dL (ref 31.5–35.7)
MCV: 102 fL — ABNORMAL HIGH (ref 79–97)
Monocytes Absolute: 0.4 10*3/uL (ref 0.1–0.9)
Monocytes: 10 %
NEUTROS ABS: 1.7 10*3/uL (ref 1.4–7.0)
Neutrophils: 44 %
RBC: 4.43 x10E6/uL (ref 4.14–5.80)
RDW: 14.1 % (ref 12.3–15.4)
WBC: 3.9 10*3/uL (ref 3.4–10.8)

## 2015-04-13 LAB — LIPID PANEL
CHOL/HDL RATIO: 2.5 ratio (ref 0.0–5.0)
Cholesterol, Total: 168 mg/dL (ref 100–199)
HDL: 66 mg/dL (ref >39)
LDL Calculated: 89 mg/dL (ref 0–99)
Triglycerides: 64 mg/dL (ref 0–149)
VLDL CHOLESTEROL CAL: 13 mg/dL (ref 5–40)

## 2015-04-13 LAB — TSH: TSH: 2.18 u[IU]/mL (ref 0.450–4.500)

## 2015-05-02 ENCOUNTER — Ambulatory Visit (INDEPENDENT_AMBULATORY_CARE_PROVIDER_SITE_OTHER): Payer: Medicare Other | Admitting: Family Medicine

## 2015-05-02 VITALS — BP 102/70 | HR 72 | Ht 72.0 in | Wt 202.0 lb

## 2015-05-02 DIAGNOSIS — I2510 Atherosclerotic heart disease of native coronary artery without angina pectoris: Secondary | ICD-10-CM

## 2015-05-02 DIAGNOSIS — R5382 Chronic fatigue, unspecified: Secondary | ICD-10-CM

## 2015-05-02 DIAGNOSIS — I495 Sick sinus syndrome: Secondary | ICD-10-CM | POA: Diagnosis not present

## 2015-05-02 DIAGNOSIS — I10 Essential (primary) hypertension: Secondary | ICD-10-CM | POA: Diagnosis not present

## 2015-05-02 DIAGNOSIS — R748 Abnormal levels of other serum enzymes: Secondary | ICD-10-CM

## 2015-05-02 DIAGNOSIS — G4733 Obstructive sleep apnea (adult) (pediatric): Secondary | ICD-10-CM

## 2015-05-02 DIAGNOSIS — Z79899 Other long term (current) drug therapy: Secondary | ICD-10-CM

## 2015-05-02 DIAGNOSIS — Z7901 Long term (current) use of anticoagulants: Secondary | ICD-10-CM

## 2015-05-02 DIAGNOSIS — R2681 Unsteadiness on feet: Secondary | ICD-10-CM

## 2015-05-02 DIAGNOSIS — I480 Paroxysmal atrial fibrillation: Secondary | ICD-10-CM

## 2015-05-02 DIAGNOSIS — E559 Vitamin D deficiency, unspecified: Secondary | ICD-10-CM

## 2015-05-02 DIAGNOSIS — E785 Hyperlipidemia, unspecified: Secondary | ICD-10-CM | POA: Diagnosis not present

## 2015-05-02 DIAGNOSIS — R7303 Prediabetes: Secondary | ICD-10-CM

## 2015-05-02 DIAGNOSIS — D7589 Other specified diseases of blood and blood-forming organs: Secondary | ICD-10-CM

## 2015-05-02 DIAGNOSIS — H409 Unspecified glaucoma: Secondary | ICD-10-CM

## 2015-05-02 DIAGNOSIS — Z8679 Personal history of other diseases of the circulatory system: Secondary | ICD-10-CM | POA: Diagnosis not present

## 2015-05-03 ENCOUNTER — Encounter: Payer: Self-pay | Admitting: Family Medicine

## 2015-05-03 DIAGNOSIS — R2681 Unsteadiness on feet: Secondary | ICD-10-CM | POA: Diagnosis not present

## 2015-05-03 DIAGNOSIS — I495 Sick sinus syndrome: Secondary | ICD-10-CM | POA: Insufficient documentation

## 2015-05-03 DIAGNOSIS — Z79899 Other long term (current) drug therapy: Secondary | ICD-10-CM | POA: Insufficient documentation

## 2015-05-03 DIAGNOSIS — Z7901 Long term (current) use of anticoagulants: Secondary | ICD-10-CM | POA: Insufficient documentation

## 2015-05-03 DIAGNOSIS — R7303 Prediabetes: Secondary | ICD-10-CM | POA: Insufficient documentation

## 2015-05-03 DIAGNOSIS — Z8679 Personal history of other diseases of the circulatory system: Secondary | ICD-10-CM | POA: Insufficient documentation

## 2015-05-03 DIAGNOSIS — E559 Vitamin D deficiency, unspecified: Secondary | ICD-10-CM | POA: Diagnosis not present

## 2015-05-03 DIAGNOSIS — H409 Unspecified glaucoma: Secondary | ICD-10-CM | POA: Insufficient documentation

## 2015-05-03 NOTE — Progress Notes (Signed)
Date:  05/02/2015   Name:  Jeremy Kane   DOB:  March 19, 1941   MRN:  [id number]  PCP:  Wilhemena Durie, MD    Chief Complaint: New Evaluation   History of Present Illness:  This is a 75 y.o. male for a second opinion regarding his geriatric care. His primary complaint is increased fatigue over the past 3 months, especially when climbing stairs. He was seen by cardiology in December and attempt made to decrease his beta blocker dose but he had tachycardia that resolved with return to his usual dose. Was seen by his usual PCP for CPE two months ago, blood work showed persistently elevated AST and persistent macrocytosis, normal TSH. Hx prediabetes but a1c 5.6% in November. PMH significant for aortic and tricuspid valve repairs in 2013, paroxysmal afib on Tikosyn and Pradaxa, SSS with pacemaker placement, HTN on lisinopril and metoprolol, HLD on psyllium powder, glaucoma. No new medications. Weight is down and appetite poor. Also hx OSA on CPAP x 2-3 yrs, no f/u after initial titration. Father died prostate ca, mother died old age, brother died 57, sister died CJD, one sister healthy. Imms UTD per records, had last colonoscopy in 2012.  Review of Systems:  Review of Systems  Constitutional: Negative for fever and chills.  HENT: Negative for ear pain, sore throat, trouble swallowing and voice change.   Eyes: Negative for pain and visual disturbance.  Respiratory: Negative for cough and shortness of breath.   Cardiovascular: Negative for chest pain and leg swelling.  Gastrointestinal: Negative for abdominal pain, diarrhea and constipation.  Endocrine: Negative for polyuria.  Genitourinary: Negative for difficulty urinating.  Musculoskeletal: Negative for gait problem.  Neurological: Negative for tremors, syncope and light-headedness.  Hematological: Negative for adenopathy.  Psychiatric/Behavioral: Negative for sleep disturbance.    Patient Active Problem List   Diagnosis Date Noted   . Sick sinus syndrome ([place]) 05/03/2015  . Hx of valvular heart disease 05/03/2015  . Prediabetes 05/03/2015  . Long term current use of antiarrhythmic drug 05/03/2015  . Long term current use of anticoagulant 05/03/2015  . Colon polyp 08/07/2014  . HLD (hyperlipidemia) 08/07/2014  . Hypertension 08/07/2014  . Thrombocytopenia ([place]) 08/07/2014  . Obstructive apnea 05/03/2014  . Ventral hernia 10/04/2012  . History of major vascular surgery 08/11/2011  . Artificial cardiac pacemaker 07/17/2011  . AF (paroxysmal atrial fibrillation) ([place]) 01/02/2011    Prior to Admission medications   Medication Sig Start Date End Date Taking? Authorizing Provider  dabigatran (PRADAXA) 150 MG CAPS Take 150 mg by mouth 2 (two) times daily.   Yes Historical Provider, MD  dofetilide (TIKOSYN) 500 MCG capsule Take 500 mcg by mouth 2 (two) times daily.   Yes Historical Provider, MD  latanoprost (XALATAN) 0.005 % ophthalmic solution Place 1 drop into both eyes daily.   Yes Historical Provider, MD  lisinopril (PRINIVIL,ZESTRIL) 10 MG tablet TAKE 1 TABLET EVERY DAY 11/16/14  Yes Jerrol Banana., MD  Magnesium 400 MG CAPS Take 1 capsule by mouth 2 (two) times daily.   Yes Historical Provider, MD  metoprolol tartrate (LOPRESSOR) 25 MG tablet Take 25 mg by mouth 2 (two) times daily.   Yes Historical Provider, MD  Psyllium 48.57 % POWD Take by mouth. 08/26/11  Yes Historical Provider, MD    Allergies  Allergen Reactions  . Sulfa Antibiotics     Unsure of reaction as allergy happened as a child  . Coumadin [Warfarin Sodium] Rash    Past Surgical History  Procedure Laterality Date  . Cardiac surgery  2013  . Pacemaker insertion  2013  . Hernia repair  123456    umbilical  . Anal fissure repair    . Cataract extraction    . Tonsillectomy    . Aortic valve repair    . Olecranon bursectomy Left 09/08/2014    Procedure: OLECRANON BURSA;  Surgeon: Christophe Louis, MD;  Location: [facility] ORS;  Service:  Orthopedics;  Laterality: Left;    Social History  Substance Use Topics  . Smoking status: Former Smoker -- 1.00 packs/day for 19 years  . Smokeless tobacco: Never Used  . Alcohol Use: Yes     Comment: scotch every night    Family History  Problem Relation Age of Onset  . Alzheimer's disease Mother   . Heart disease Mother   . Alcohol abuse Father   . Prostate cancer Father   . HIV/AIDS Brother     Medication list has been reviewed and updated.  Physical Examination: BP 102/70 mmHg  Pulse 72  Ht 6' (1.829 m)  Wt 202 lb (91.627 kg)  BMI 27.39 kg/m2  Physical Exam  Constitutional: He is oriented to person, place, and time. He appears well-developed and well-nourished.  HENT:  Head: Normocephalic and atraumatic.  Right Ear: External ear normal.  Left Ear: External ear normal.  Nose: Nose normal.  Mouth/Throat: Oropharynx is clear and moist.  Eyes: Conjunctivae and EOM are normal. Pupils are equal, round, and reactive to light. No scleral icterus.  Neck: Neck supple. No thyromegaly present.  Cardiovascular: Normal rate, regular rhythm, normal heart sounds and intact distal pulses.   Pulmonary/Chest: Effort normal and breath sounds normal.  Abdominal: Soft. He exhibits no distension and no mass. There is no tenderness.  Small ventral hernia  Musculoskeletal: He exhibits no edema.  Lymphadenopathy:    He has no cervical adenopathy.  Neurological: He is alert and oriented to person, place, and time.  Romberg positive, gait wide-based, no tremors noted  Skin: Skin is warm and dry.  Psychiatric: He has a normal mood and affect. His behavior is normal.  Nursing note and vitals reviewed.   Assessment and Plan:  1. Chronic fatigue past three months Unclear etiology, consider vit deficiencies, suboptimal OSA control  2. Elevated liver enzymes Persistent, denies excessive EtOH use - US Abdomen Limited RUQ; Future  3. Gait instability Unclear etiology, consider vit  def, EtOH - Folate - B12  4. Macrocytosis Unclear etiology, consider vit def, EtOH  5. Paroxysmal atrial fibrillation (HCC) Well controlled, followed by cardiology, unclear benefit of rhythm control  6. Essential hypertension Well controlled on lisinopril/metoprolol, intolerant metoprolol taper  7. Obstructive apnea May be contributing to fatigue, consider f/u for CPAP titration  8. HLD (hyperlipidemia) Well controlled (LDL 89) on psyllium  9. Hx of hypomagnesemia Unclear need for continued magnesium supplementation - Magnesium  10. Vitamin D deficiency Likely given setting, may contribute to gait instability - Vitamin D (25 hydroxy)  11. Sick sinus syndrome ([place]) Well controlled with pacemaker  12. Hx of prediabetes A1c 5.6 in November  13. Hx of valvular heart disease  14. Long term current use of antiarrhythmic drug  15. Long term current use of anticoagulant  Return in about 4 weeks (around 05/30/2015).  [name]. [facility]  05/03/2015

## 2015-05-04 ENCOUNTER — Telehealth: Payer: Self-pay

## 2015-05-04 LAB — MAGNESIUM: Magnesium: 2.1 mg/dL (ref 1.6–2.3)

## 2015-05-04 LAB — VITAMIN B12: VITAMIN B 12: 400 pg/mL (ref 211–946)

## 2015-05-04 LAB — VITAMIN D 25 HYDROXY (VIT D DEFICIENCY, FRACTURES): VIT D 25 HYDROXY: 16.8 ng/mL — AB (ref 30.0–100.0)

## 2015-05-04 LAB — FOLATE: FOLATE: 9.3 ng/mL (ref >3.0)

## 2015-05-04 NOTE — Telephone Encounter (Signed)
Spoke with patient. Patient advised of all results and verbalized understanding. Will call back with any future questions or concerns. MAH  

## 2015-05-04 NOTE — Telephone Encounter (Signed)
-----   Message from Adline Potter, MD sent at 05/04/2015  9:52 AM EST ----- Inform magnesium level high normal and vitamin D level low. Recommend stop magnesium supplement and begin OTC vitamin D3 2000 units daily.

## 2015-05-07 ENCOUNTER — Ambulatory Visit
Admission: RE | Admit: 2015-05-07 | Discharge: 2015-05-07 | Disposition: A | Discharge status: Home / Self Care | Payer: Medicare Other | Source: Ambulatory Visit | Attending: Family Medicine | Admitting: Family Medicine

## 2015-05-07 DIAGNOSIS — R945 Abnormal results of liver function studies: Secondary | ICD-10-CM | POA: Diagnosis not present

## 2015-05-07 DIAGNOSIS — R748 Abnormal levels of other serum enzymes: Secondary | ICD-10-CM | POA: Diagnosis not present

## 2015-05-07 DIAGNOSIS — R932 Abnormal findings on diagnostic imaging of liver and biliary tract: Secondary | ICD-10-CM | POA: Diagnosis not present

## 2015-05-08 ENCOUNTER — Other Ambulatory Visit: Payer: Self-pay | Admitting: Family Medicine

## 2015-05-08 DIAGNOSIS — G4733 Obstructive sleep apnea (adult) (pediatric): Secondary | ICD-10-CM

## 2015-05-30 ENCOUNTER — Encounter: Payer: Self-pay | Admitting: Family Medicine

## 2015-05-30 ENCOUNTER — Ambulatory Visit (INDEPENDENT_AMBULATORY_CARE_PROVIDER_SITE_OTHER): Payer: Medicare Other | Admitting: Family Medicine

## 2015-05-30 VITALS — BP 116/78 | HR 63 | Ht 72.0 in | Wt 207.0 lb

## 2015-05-30 DIAGNOSIS — R7303 Prediabetes: Secondary | ICD-10-CM

## 2015-05-30 DIAGNOSIS — E559 Vitamin D deficiency, unspecified: Secondary | ICD-10-CM | POA: Diagnosis not present

## 2015-05-30 DIAGNOSIS — G4733 Obstructive sleep apnea (adult) (pediatric): Secondary | ICD-10-CM | POA: Diagnosis not present

## 2015-05-30 DIAGNOSIS — I10 Essential (primary) hypertension: Secondary | ICD-10-CM

## 2015-05-30 DIAGNOSIS — H409 Unspecified glaucoma: Secondary | ICD-10-CM

## 2015-05-30 DIAGNOSIS — R5383 Other fatigue: Secondary | ICD-10-CM | POA: Diagnosis not present

## 2015-05-30 DIAGNOSIS — K760 Fatty (change of) liver, not elsewhere classified: Secondary | ICD-10-CM

## 2015-05-30 DIAGNOSIS — I2510 Atherosclerotic heart disease of native coronary artery without angina pectoris: Secondary | ICD-10-CM | POA: Diagnosis not present

## 2015-05-30 DIAGNOSIS — I480 Paroxysmal atrial fibrillation: Secondary | ICD-10-CM | POA: Diagnosis not present

## 2015-05-30 MED ORDER — VITAMIN D 50 MCG (2000 UT) PO CAPS: 1.0000 | ORAL_CAPSULE | Freq: Every day | ORAL | Status: DC

## 2015-05-30 NOTE — Progress Notes (Signed)
Date:  05/30/2015   Name:  Jeremy Kane   DOB:  21-Jun-1940   MRN:  [id number]  PCP:  Wilhemena Durie, MD    Chief Complaint: Follow-up and Fatigue   History of Present Illness:  This is a 75 y.o. male for one month f/u of second geriatric opinion. Reports fatigue significantly improved over past two weeks. Blood work showed high normal Mg level and low vit D level, Mg supp stopped and vit D supp started. Liver US showed steatosis and old granulomatous dz only. Has not yet discussed Tikosyn use with cardiologist.  Review of Systems:  Review of Systems  Constitutional: Negative for fever and fatigue.  Respiratory: Negative for shortness of breath.   Cardiovascular: Negative for chest pain and leg swelling.  Endocrine: Negative for polyuria.  Genitourinary: Negative for difficulty urinating.  Neurological: Negative for syncope and light-headedness.    Patient Active Problem List   Diagnosis Date Noted  . Vitamin D deficiency 05/30/2015  . Hepatic steatosis 05/30/2015  . Sick sinus syndrome ([place]) 05/03/2015  . Hx of valvular heart disease 05/03/2015  . Prediabetes 05/03/2015  . Long term current use of antiarrhythmic drug 05/03/2015  . Long term current use of anticoagulant 05/03/2015  . Glaucoma 05/03/2015  . Colon polyp 08/07/2014  . HLD (hyperlipidemia) 08/07/2014  . Hypertension 08/07/2014  . Thrombocytopenia ([place]) 08/07/2014  . Obstructive apnea 05/03/2014  . Ventral hernia 10/04/2012  . History of major vascular surgery 08/11/2011  . Artificial cardiac pacemaker 07/17/2011  . AF (paroxysmal atrial fibrillation) ([place]) 01/02/2011    Prior to Admission medications   Medication Sig Start Date End Date Taking? Authorizing Provider  dabigatran (PRADAXA) 150 MG CAPS Take 150 mg by mouth 2 (two) times daily.   Yes Historical Provider, MD  dofetilide (TIKOSYN) 500 MCG capsule Take 500 mcg by mouth 2 (two) times daily.   Yes Historical Provider, MD  latanoprost (XALATAN)  0.005 % ophthalmic solution Place 1 drop into both eyes daily.   Yes Historical Provider, MD  lisinopril (PRINIVIL,ZESTRIL) 10 MG tablet TAKE 1 TABLET EVERY DAY 11/16/14  Yes Jerrol Banana., MD  metoprolol tartrate (LOPRESSOR) 25 MG tablet Take 25 mg by mouth 2 (two) times daily.   Yes Historical Provider, MD  Psyllium 48.57 % POWD Take by mouth. 08/26/11  Yes Historical Provider, MD  Cholecalciferol (VITAMIN D) 2000 units CAPS Take 1 capsule (2,000 Units total) by mouth daily. 05/30/15   Adline Potter, MD    Allergies  Allergen Reactions  . Sulfa Antibiotics     Unsure of reaction as allergy happened as a child  . Coumadin [Warfarin Sodium] Rash    Past Surgical History  Procedure Laterality Date  . Cardiac surgery  2013  . Pacemaker insertion  2013  . Hernia repair  [id number]    umbilical  . Anal fissure repair    . Cataract extraction    . Tonsillectomy    . Aortic valve repair    . Olecranon bursectomy Left 09/08/2014    Procedure: OLECRANON BURSA;  Surgeon: Christophe Louis, MD;  Location: [facility] ORS;  Service: Orthopedics;  Laterality: Left;    Social History  Substance Use Topics  . Smoking status: Former Smoker -- 1.00 packs/day for 19 years  . Smokeless tobacco: Never Used  . Alcohol Use: Yes     Comment: scotch every night    Family History  Problem Relation Age of Onset  . Alzheimer's disease Mother   . Heart disease  Mother   . Alcohol abuse Father   . Prostate cancer Father   . HIV/AIDS Brother     Medication list has been reviewed and updated.  Physical Examination: BP 116/78 mmHg  Pulse 63  Ht 6' (1.829 m)  Wt 207 lb (93.895 kg)  BMI 28.07 kg/m2  Physical Exam  Constitutional: He appears well-developed and well-nourished.  Cardiovascular: Normal rate, regular rhythm and normal heart sounds.   Pulmonary/Chest: Effort normal and breath sounds normal.  Musculoskeletal: He exhibits no edema.  Neurological: He is alert.  Skin: Skin is warm and dry.   Psychiatric: He has a normal mood and affect. His behavior is normal.  Nursing note and vitals reviewed.   Assessment and Plan:  1. Other fatigue Etiology unclear but significantly improved past two weeks  2. Essential hypertension Well controlled on metoprolol/lisinopril  3. AF (paroxysmal atrial fibrillation) ([place]) Well controlled on Tikosyn/Pradaxa, pt to discuss further need for Tikosyn with Dr. Nehemiah Massed  4. Prediabetes Well controlled in November; NCS diet, exercise, weight loss discussed  5. Vitamin D deficiency On supplement, consider recheck level in two months  6. Hepatic steatosis Dx/px discussed, consider LFT monitoring  7. Glaucoma Well controlled on Xalatan, followed by optho  8. OSA Consider referral for CPAP titration if fatigue persists  Pt will continue to receive his primary care from Dr. Rosanna Randy at [place]  Return if symptoms worsen or fail to improve.  [name]. Burt Ek MD Board Certified in [facility]  05/30/2015

## 2015-05-31 ENCOUNTER — Ambulatory Visit: Payer: Medicare Other | Attending: Otolaryngology

## 2015-05-31 DIAGNOSIS — G4733 Obstructive sleep apnea (adult) (pediatric): Secondary | ICD-10-CM | POA: Diagnosis not present

## 2015-06-07 DIAGNOSIS — Z79899 Other long term (current) drug therapy: Secondary | ICD-10-CM | POA: Diagnosis not present

## 2015-06-07 DIAGNOSIS — I472 Ventricular tachycardia: Secondary | ICD-10-CM | POA: Diagnosis not present

## 2015-06-07 DIAGNOSIS — E782 Mixed hyperlipidemia: Secondary | ICD-10-CM | POA: Diagnosis not present

## 2015-06-07 DIAGNOSIS — I351 Nonrheumatic aortic (valve) insufficiency: Secondary | ICD-10-CM | POA: Diagnosis not present

## 2015-06-07 DIAGNOSIS — I482 Chronic atrial fibrillation: Secondary | ICD-10-CM | POA: Diagnosis not present

## 2015-06-07 DIAGNOSIS — I361 Nonrheumatic tricuspid (valve) insufficiency: Secondary | ICD-10-CM | POA: Diagnosis not present

## 2015-06-07 DIAGNOSIS — G4733 Obstructive sleep apnea (adult) (pediatric): Secondary | ICD-10-CM | POA: Diagnosis not present

## 2015-06-12 DIAGNOSIS — H401132 Primary open-angle glaucoma, bilateral, moderate stage: Secondary | ICD-10-CM | POA: Diagnosis not present

## 2015-06-13 DIAGNOSIS — I4891 Unspecified atrial fibrillation: Secondary | ICD-10-CM | POA: Diagnosis not present

## 2015-06-13 DIAGNOSIS — Z1211 Encounter for screening for malignant neoplasm of colon: Secondary | ICD-10-CM | POA: Diagnosis not present

## 2015-06-14 ENCOUNTER — Other Ambulatory Visit: Payer: Self-pay

## 2015-06-14 ENCOUNTER — Encounter: Payer: Self-pay | Admitting: Family Medicine

## 2015-06-14 ENCOUNTER — Telehealth: Payer: Self-pay

## 2015-06-14 DIAGNOSIS — F1010 Alcohol abuse, uncomplicated: Secondary | ICD-10-CM | POA: Insufficient documentation

## 2015-06-14 NOTE — Telephone Encounter (Signed)
Don't see anything in Epic, can we find out where it was done and call them?

## 2015-06-14 NOTE — Telephone Encounter (Signed)
Sent to [facility] 

## 2015-06-14 NOTE — Telephone Encounter (Signed)
If you go under chart review and look in media (I think is where I saw it) The results are in there. (They caught him with "airplane bottles of alcohol during the sleep study")

## 2015-06-14 NOTE — Telephone Encounter (Signed)
Study positive for sleep apnea and CPAP titrated to 16. CPAP machine for nightly use should have been arranged.

## 2015-06-19 DIAGNOSIS — G4733 Obstructive sleep apnea (adult) (pediatric): Secondary | ICD-10-CM | POA: Diagnosis not present

## 2015-07-20 ENCOUNTER — Encounter: Payer: Self-pay | Admitting: *Deleted

## 2015-07-23 ENCOUNTER — Encounter: Payer: Self-pay | Admitting: *Deleted

## 2015-07-23 ENCOUNTER — Ambulatory Visit: Payer: Medicare Other | Admitting: Certified Registered Nurse Anesthetist

## 2015-07-23 ENCOUNTER — Encounter
Admission: RE | Disposition: A | Discharge status: Home / Self Care | Payer: Self-pay | Source: Ambulatory Visit | Attending: Gastroenterology

## 2015-07-23 ENCOUNTER — Ambulatory Visit
Admission: RE | Admit: 2015-07-23 | Discharge: 2015-07-23 | Disposition: A | Discharge status: Home / Self Care | Payer: Medicare Other | Source: Ambulatory Visit | Attending: Gastroenterology | Admitting: Gastroenterology

## 2015-07-23 DIAGNOSIS — D124 Benign neoplasm of descending colon: Secondary | ICD-10-CM | POA: Diagnosis not present

## 2015-07-23 DIAGNOSIS — G4730 Sleep apnea, unspecified: Secondary | ICD-10-CM | POA: Insufficient documentation

## 2015-07-23 DIAGNOSIS — I2510 Atherosclerotic heart disease of native coronary artery without angina pectoris: Secondary | ICD-10-CM | POA: Diagnosis not present

## 2015-07-23 DIAGNOSIS — I868 Varicose veins of other specified sites: Secondary | ICD-10-CM | POA: Diagnosis not present

## 2015-07-23 DIAGNOSIS — Z1211 Encounter for screening for malignant neoplasm of colon: Secondary | ICD-10-CM | POA: Diagnosis present

## 2015-07-23 DIAGNOSIS — I495 Sick sinus syndrome: Secondary | ICD-10-CM | POA: Insufficient documentation

## 2015-07-23 DIAGNOSIS — K635 Polyp of colon: Secondary | ICD-10-CM | POA: Diagnosis not present

## 2015-07-23 DIAGNOSIS — K5790 Diverticulosis of intestine, part unspecified, without perforation or abscess without bleeding: Secondary | ICD-10-CM | POA: Diagnosis not present

## 2015-07-23 DIAGNOSIS — Z888 Allergy status to other drugs, medicaments and biological substances status: Secondary | ICD-10-CM | POA: Diagnosis not present

## 2015-07-23 DIAGNOSIS — Z9849 Cataract extraction status, unspecified eye: Secondary | ICD-10-CM | POA: Diagnosis not present

## 2015-07-23 DIAGNOSIS — I4891 Unspecified atrial fibrillation: Secondary | ICD-10-CM | POA: Diagnosis not present

## 2015-07-23 DIAGNOSIS — Z882 Allergy status to sulfonamides status: Secondary | ICD-10-CM | POA: Insufficient documentation

## 2015-07-23 DIAGNOSIS — Z950 Presence of cardiac pacemaker: Secondary | ICD-10-CM | POA: Diagnosis not present

## 2015-07-23 DIAGNOSIS — E7800 Pure hypercholesterolemia, unspecified: Secondary | ICD-10-CM | POA: Diagnosis not present

## 2015-07-23 DIAGNOSIS — Z87891 Personal history of nicotine dependence: Secondary | ICD-10-CM | POA: Insufficient documentation

## 2015-07-23 DIAGNOSIS — K640 First degree hemorrhoids: Secondary | ICD-10-CM | POA: Insufficient documentation

## 2015-07-23 DIAGNOSIS — Z86010 Personal history of colonic polyps: Secondary | ICD-10-CM | POA: Diagnosis not present

## 2015-07-23 DIAGNOSIS — Z7901 Long term (current) use of anticoagulants: Secondary | ICD-10-CM | POA: Insufficient documentation

## 2015-07-23 DIAGNOSIS — Z79899 Other long term (current) drug therapy: Secondary | ICD-10-CM | POA: Diagnosis not present

## 2015-07-23 DIAGNOSIS — Z952 Presence of prosthetic heart valve: Secondary | ICD-10-CM | POA: Diagnosis not present

## 2015-07-23 DIAGNOSIS — K5730 Diverticulosis of large intestine without perforation or abscess without bleeding: Secondary | ICD-10-CM | POA: Insufficient documentation

## 2015-07-23 DIAGNOSIS — K6289 Other specified diseases of anus and rectum: Secondary | ICD-10-CM | POA: Diagnosis not present

## 2015-07-23 DIAGNOSIS — K648 Other hemorrhoids: Secondary | ICD-10-CM | POA: Diagnosis not present

## 2015-07-23 HISTORY — DX: Sleep apnea, unspecified: G47.30

## 2015-07-23 HISTORY — DX: Pure hypercholesterolemia, unspecified: E78.00

## 2015-07-23 HISTORY — PX: COLONOSCOPY WITH PROPOFOL: SHX5780

## 2015-07-23 HISTORY — DX: Atherosclerotic heart disease of native coronary artery without angina pectoris: I25.10

## 2015-07-23 HISTORY — DX: Sick sinus syndrome: I49.5

## 2015-07-23 LAB — HM COLONOSCOPY

## 2015-07-23 LAB — SURGICAL PATHOLOGY

## 2015-07-23 SURGERY — COLONOSCOPY WITH PROPOFOL
Anesthesia: General

## 2015-07-23 MED ORDER — SODIUM CHLORIDE 0.9 % IV SOLN
INTRAVENOUS | Status: DC
  Administered 2015-07-23: 08:00:00 via INTRAVENOUS

## 2015-07-23 MED ORDER — PROPOFOL 500 MG/50ML IV EMUL
INTRAVENOUS | Status: DC | PRN
  Administered 2015-07-23: 140 ug/kg/min via INTRAVENOUS

## 2015-07-23 MED ORDER — MIDAZOLAM HCL 5 MG/5ML IJ SOLN
INTRAMUSCULAR | Status: DC | PRN
  Administered 2015-07-23: 1 mg via INTRAVENOUS

## 2015-07-23 MED ORDER — FENTANYL CITRATE (PF) 100 MCG/2ML IJ SOLN
INTRAMUSCULAR | Status: DC | PRN
  Administered 2015-07-23: 50 ug via INTRAVENOUS

## 2015-07-23 MED ORDER — PROPOFOL 10 MG/ML IV BOLUS
INTRAVENOUS | Status: DC | PRN
  Administered 2015-07-23: 50 mg via INTRAVENOUS
  Administered 2015-07-23: 20 mg via INTRAVENOUS

## 2015-07-23 MED ORDER — SODIUM CHLORIDE 0.9 % IV SOLN: INTRAVENOUS | Status: DC

## 2015-07-23 NOTE — Transfer of Care (Signed)
Immediate Anesthesia Transfer of Care Note  Patient: Jeremy Kane  Procedure(s) Performed: Procedure(s): COLONOSCOPY WITH PROPOFOL (N/A)  Patient Location: PACU and Endoscopy Unit  Anesthesia Type:General  Level of Consciousness: awake, alert  and oriented  Airway & Oxygen Therapy: Patient Spontanous Breathing and Patient connected to nasal cannula oxygen  Post-op Assessment: Report given to RN and Post -op Vital signs reviewed and stable  Post vital signs: Reviewed and stable  Last Vitals:  Filed Vitals:   07/23/15 0745 07/23/15 0919  BP: 114/82   Pulse: 59   Temp: 36.5 C 36.1 C  Resp: 16     Last Pain: There were no vitals filed for this visit.       Complications: No apparent anesthesia complications

## 2015-07-23 NOTE — H&P (Signed)
Outpatient short stay form Pre-procedure 07/23/2015 8:40 AM Jeremy Sails MD  Primary Physician: Dr. Miguel Aschoff  Reason for visit:  Screening colonoscopy  History of present illness:  Patient is a 75 year old male presenting today for screening colonoscopy. He tolerated his prep well. He does state Pradaxa but has held that for several days. He has normal renal function.  Patient does have a history of aortic and tricuspid valve replacement on 05/09/2011. These were porcine valves per patient. Currently" American Heart Association does not consider surgeries or procedures in the digestive or urinary system without high risk of causing infective endocarditis. This would include colonoscopy, sigmoidoscopy, cystoscopy and many other procedures. Antibiotics are no longer routinely recommended before these procedures even empty with highest heart risk"--excerpt from up-to-date.com.    Current facility-administered medications:  .  0.9 %  sodium chloride infusion, , Intravenous, Continuous, Jeremy Sails, MD, Last Rate: 20 mL/hr at 07/23/15 0803 .  0.9 %  sodium chloride infusion, , Intravenous, Continuous, Jeremy Sails, MD  Prescriptions prior to admission  Medication Sig Dispense Refill Last Dose  . dofetilide (TIKOSYN) 500 MCG capsule Take 500 mcg by mouth 2 (two) times daily.   07/23/2015 at 0545  . metoprolol tartrate (LOPRESSOR) 25 MG tablet Take 25 mg by mouth 2 (two) times daily.   07/23/2015 at 0545  . Cholecalciferol (VITAMIN D) 2000 units CAPS Take 1 capsule (2,000 Units total) by mouth daily. 30 capsule    . dabigatran (PRADAXA) 150 MG CAPS Take 150 mg by mouth 2 (two) times daily.   07/20/2015  . latanoprost (XALATAN) 0.005 % ophthalmic solution Place 1 drop into both eyes daily.   Taking  . lisinopril (PRINIVIL,ZESTRIL) 10 MG tablet TAKE 1 TABLET EVERY DAY 30 tablet 12 Taking  . Psyllium 48.57 % POWD Take by mouth.   Taking     Allergies  Allergen Reactions  . Sulfa  Antibiotics     Unsure of reaction as allergy happened as a child  . Coumadin [Warfarin Sodium] Rash     Past Medical History  Diagnosis Date  . A-fib ([place])   . Atrial fibrillation ([place]) 10/05/2012  . Dysrhythmia   . Coronary artery disease   . Hypercholesterolemia   . Sick sinus syndrome ([place])   . Sleep apnea     Review of systems:      Physical Exam    Heart and lungs: Regular rate and rhythm without rub or gallop, lungs are bilaterally clear    HEENT: Normocephalic atraumatic eyes are anicteric    Other:     Pertinant exam for procedure: Soft nontender nondistended bowel sounds positive normoactive    Planned proceedures: Colonoscopy and indicated procedures I have discussed the risks benefits and complications of procedures to include not limited to bleeding, infection, perforation and the risk of sedation and the patient wishes to proceed.    Jeremy Sails, MD Gastroenterology 07/23/2015  8:40 AM

## 2015-07-23 NOTE — Anesthesia Preprocedure Evaluation (Signed)
Anesthesia Evaluation  Patient identified by MRN, date of birth, ID band Patient awake    Reviewed: Allergy & Precautions, H&P , NPO status , Patient's Chart, lab work & pertinent test results, reviewed documented beta blocker date and time   Airway Mallampati: II   Neck ROM: full    Dental   Pulmonary neg pulmonary ROS, sleep apnea , former smoker,    Pulmonary exam normal        Cardiovascular hypertension, + CAD  negative cardio ROS Normal cardiovascular exam+ dysrhythmias      Neuro/Psych negative neurological ROS  negative psych ROS   GI/Hepatic negative GI ROS, Neg liver ROS,   Endo/Other  negative endocrine ROS  Renal/GU negative Renal ROS  negative genitourinary   Musculoskeletal   Abdominal   Peds  Hematology negative hematology ROS (+)   Anesthesia Other Findings Past Medical History:   A-fib [facility])                                                  Atrial fibrillation ([place])                       10/05/2012    Dysrhythmia                                                  Coronary artery disease                                      Hypercholesterolemia                                         Sick sinus syndrome ([place])                                    Sleep apnea                                                Past Surgical History:   CARDIAC SURGERY                                  2013         PACEMAKER INSERTION                              2013         HERNIA REPAIR                                    2014           Comment:umbilical   ANAL FISSURE REPAIR  CATARACT EXTRACTION                                           TONSILLECTOMY                                                 AORTIC VALVE REPAIR                                           OLECRANON BURSECTOMY                            Left 09/08/2014      Comment:Procedure: OLECRANON BURSA;  Surgeon:   Christophe Louis, MD;  Location: [facility] ORS;                Service: Orthopedics;  Laterality: Left; BMI    Body Mass Index   25.92 kg/m 2     Reproductive/Obstetrics                             Anesthesia Physical Anesthesia Plan  ASA: III  Anesthesia Plan: General   Post-op Pain Management:    Induction:   Airway Management Planned:   Additional Equipment:   Intra-op Plan:   Post-operative Plan:   Informed Consent: I have reviewed the patients History and Physical, chart, labs and discussed the procedure including the risks, benefits and alternatives for the proposed anesthesia with the patient or authorized representative who has indicated his/her understanding and acceptance.   Dental Advisory Given  Plan Discussed with: CRNA  Anesthesia Plan Comments:         Anesthesia Quick Evaluation

## 2015-07-23 NOTE — Anesthesia Procedure Notes (Signed)
Performed by: Kennon Holter Pre-anesthesia Checklist: Patient being monitored, Timeout performed, Suction available, Emergency Drugs available and Patient identified Oxygen Delivery Method: Nasal cannula Preoxygenation: Pre-oxygenation with 100% oxygen Intubation Type: IV induction Placement Confirmation: positive ETCO2

## 2015-07-23 NOTE — Op Note (Signed)
[facility] Gastroenterology Patient Name: Jeremy Kane Procedure Date: 07/23/2015 8:35 AM MRN: [id number] Account #: [id number] Date of Birth: 07/18/1940 Admit Type: Outpatient Age: 75 Room: [facility] ENDO ROOM 3 Gender: Male Note Status: Finalized Procedure:            Colonoscopy Indications:          Screening for colorectal malignant neoplasm Providers:            Lollie Sails, MD Referring MD:         Janine Ores. Rosanna Randy, MD (Referring MD) Medicines:            Monitored Anesthesia Care Complications:        No immediate complications. Procedure:            Pre-Anesthesia Assessment:                       - ASA Grade Assessment: III - A patient with severe                        systemic disease.                       After obtaining informed consent, the colonoscope was                        passed under direct vision. Throughout the procedure,                        the patient's blood pressure, pulse, and oxygen                        saturations were monitored continuously. The                        Colonoscope was introduced through the anus and                        advanced to the the cecum, identified by appendiceal                        orifice and ileocecal valve. The patient tolerated the                        procedure well. The quality of the bowel preparation                        was fair except the ascending colon was poor. The                        colonoscopy was performed without difficulty. The                        colonoscopy was performed with moderate difficulty due                        to poor bowel prep. Successful completion of the                        procedure was aided by lavage. Findings:      A 3 mm polyp was found in the proximal  descending colon. The polyp was       flat. The polyp was removed with a cold biopsy forceps. Resection and       retrieval were complete.      Multiple small and large-mouthed  diverticula were found in the sigmoid       colon, descending colon, splenic flexure, transverse colon, hepatic       flexure, ascending colon and cecum.      Non-bleeding internal hemorrhoids were found during retroflexion and       during anoscopy. The hemorrhoids were small and Grade I (internal       hemorrhoids that do not prolapse).      Small, non-bleeding rectal varices were found. Impression:           - One 3 mm polyp in the proximal descending colon,                        removed with a cold biopsy forceps. Resected and                        retrieved.                       - Diverticulosis in the sigmoid colon, in the                        descending colon, at the splenic flexure, in the                        transverse colon, at the hepatic flexure, in the                        ascending colon and in the cecum.                       - Non-bleeding internal hemorrhoids.                       - Rectal varices. Recommendation:       - Await pathology results.                       - Perform an abdominal ultrasound with dopplers of the                        portal splenic and hepatic veins at appointment to be                        scheduled.                       - Discharge patient to home.                       - Telephone GI clinic for pathology results in 1 week. Procedure Code(s):    --- Professional ---                       [phone], Colonoscopy, flexible; with biopsy, single or                        multiple Diagnosis Code(s):    --- Professional ---  Z12.11, Encounter for screening for malignant neoplasm                        of colon                       D12.4, Benign neoplasm of descending colon                       K64.0, First degree hemorrhoids                       K57.30, Diverticulosis of large intestine without                        perforation or abscess without bleeding CPT copyright 2016 American Medical Association. All  rights reserved. The codes documented in this report are preliminary and upon coder review may  be revised to meet current compliance requirements. Lollie Sails, MD 07/23/2015 9:16:42 AM This report has been signed electronically. Number of Addenda: 0 Note Initiated On: 07/23/2015 8:35 AM Scope Withdrawal Time: 0 hours 7 minutes 22 seconds  Total Procedure Duration: 0 hours 19 minutes 40 seconds       [facility]

## 2015-07-24 ENCOUNTER — Encounter: Payer: Self-pay | Admitting: Gastroenterology

## 2015-07-24 DIAGNOSIS — H6123 Impacted cerumen, bilateral: Secondary | ICD-10-CM | POA: Diagnosis not present

## 2015-07-24 DIAGNOSIS — H903 Sensorineural hearing loss, bilateral: Secondary | ICD-10-CM | POA: Diagnosis not present

## 2015-07-24 NOTE — Anesthesia Postprocedure Evaluation (Signed)
Anesthesia Post Note  Patient: Jeremy Kane  Procedure(s) Performed: Procedure(s) (LRB): COLONOSCOPY WITH PROPOFOL (N/A)  Patient location during evaluation: PACU Anesthesia Type: General Level of consciousness: awake and alert Pain management: pain level controlled Vital Signs Assessment: post-procedure vital signs reviewed and stable Respiratory status: spontaneous breathing, nonlabored ventilation, respiratory function stable and patient connected to nasal cannula oxygen Cardiovascular status: blood pressure returned to baseline and stable Postop Assessment: no signs of nausea or vomiting Anesthetic complications: no    Last Vitals:  Filed Vitals:   07/23/15 0939 07/23/15 0949  BP: 90/67 104/72  Pulse: 60 59  Temp:    Resp: 10 15    Last Pain:  Filed Vitals:   07/24/15 0741  PainSc: 0-No pain                 Molli Barrows

## 2015-07-26 ENCOUNTER — Other Ambulatory Visit: Payer: Self-pay | Admitting: Gastroenterology

## 2015-07-26 DIAGNOSIS — K649 Unspecified hemorrhoids: Secondary | ICD-10-CM

## 2015-08-02 ENCOUNTER — Ambulatory Visit
Admission: RE | Admit: 2015-08-02 | Discharge: 2015-08-02 | Disposition: A | Discharge status: Home / Self Care | Payer: Medicare Other | Source: Ambulatory Visit | Attending: Gastroenterology | Admitting: Gastroenterology

## 2015-08-02 ENCOUNTER — Ambulatory Visit: Payer: Medicare Other

## 2015-08-02 DIAGNOSIS — N281 Cyst of kidney, acquired: Secondary | ICD-10-CM | POA: Diagnosis not present

## 2015-08-02 DIAGNOSIS — I723 Aneurysm of iliac artery: Secondary | ICD-10-CM | POA: Diagnosis not present

## 2015-08-02 DIAGNOSIS — I714 Abdominal aortic aneurysm, without rupture: Secondary | ICD-10-CM | POA: Insufficient documentation

## 2015-08-02 DIAGNOSIS — K649 Unspecified hemorrhoids: Secondary | ICD-10-CM | POA: Insufficient documentation

## 2015-08-09 ENCOUNTER — Ambulatory Visit: Payer: Medicare Other | Admitting: Family Medicine

## 2015-08-09 DIAGNOSIS — D1800 Hemangioma unspecified site: Secondary | ICD-10-CM | POA: Diagnosis not present

## 2015-08-09 DIAGNOSIS — D692 Other nonthrombocytopenic purpura: Secondary | ICD-10-CM | POA: Diagnosis not present

## 2015-08-09 DIAGNOSIS — L821 Other seborrheic keratosis: Secondary | ICD-10-CM | POA: Diagnosis not present

## 2015-08-09 DIAGNOSIS — L578 Other skin changes due to chronic exposure to nonionizing radiation: Secondary | ICD-10-CM | POA: Diagnosis not present

## 2015-08-09 DIAGNOSIS — Z1283 Encounter for screening for malignant neoplasm of skin: Secondary | ICD-10-CM | POA: Diagnosis not present

## 2015-08-09 DIAGNOSIS — L812 Freckles: Secondary | ICD-10-CM | POA: Diagnosis not present

## 2015-08-09 DIAGNOSIS — L720 Epidermal cyst: Secondary | ICD-10-CM | POA: Diagnosis not present

## 2015-08-09 DIAGNOSIS — Z85828 Personal history of other malignant neoplasm of skin: Secondary | ICD-10-CM | POA: Diagnosis not present

## 2015-08-09 DIAGNOSIS — D485 Neoplasm of uncertain behavior of skin: Secondary | ICD-10-CM | POA: Diagnosis not present

## 2015-08-09 DIAGNOSIS — L570 Actinic keratosis: Secondary | ICD-10-CM | POA: Diagnosis not present

## 2015-08-15 DIAGNOSIS — I714 Abdominal aortic aneurysm, without rupture: Secondary | ICD-10-CM | POA: Diagnosis not present

## 2015-08-21 ENCOUNTER — Encounter: Payer: Self-pay | Admitting: Family Medicine

## 2015-08-21 ENCOUNTER — Ambulatory Visit (INDEPENDENT_AMBULATORY_CARE_PROVIDER_SITE_OTHER): Payer: Medicare Other | Admitting: Family Medicine

## 2015-08-21 VITALS — BP 110/76 | HR 66 | Temp 98.0°F | Resp 16 | Wt 205.0 lb

## 2015-08-21 DIAGNOSIS — I2510 Atherosclerotic heart disease of native coronary artery without angina pectoris: Secondary | ICD-10-CM | POA: Diagnosis not present

## 2015-08-21 DIAGNOSIS — I714 Abdominal aortic aneurysm, without rupture: Secondary | ICD-10-CM | POA: Diagnosis not present

## 2015-08-21 DIAGNOSIS — E785 Hyperlipidemia, unspecified: Secondary | ICD-10-CM | POA: Diagnosis not present

## 2015-08-21 DIAGNOSIS — I10 Essential (primary) hypertension: Secondary | ICD-10-CM | POA: Diagnosis not present

## 2015-08-21 DIAGNOSIS — E119 Type 2 diabetes mellitus without complications: Secondary | ICD-10-CM | POA: Diagnosis not present

## 2015-08-21 LAB — POCT UA - MICROALBUMIN: MICROALBUMIN (UR) POC: 50 mg/L

## 2015-08-21 LAB — POCT GLYCOSYLATED HEMOGLOBIN (HGB A1C): Hemoglobin A1C: 5.8

## 2015-08-21 NOTE — Progress Notes (Signed)
Patient ID: Jeremy Kane, male   DOB: April 07, 1940, 75 y.o.   MRN: [id number]    Subjective:  HPI  Hypertension, follow-up:  BP Readings from Last 3 Encounters:  08/21/15 110/76  07/23/15 104/72  05/30/15 116/78    He was last seen for hypertension 4 months ago.  BP at that visit was 104/72. Management since that visit includes none. He reports good compliance with treatment. He is not having side effects.  He is exercising, almost every day. He is adherent to low salt diet.   Outside blood pressures are not being checked. He is experiencing fatigue.  Patient denies chest pain, chest pressure/discomfort, claudication, dyspnea, exertional chest pressure/discomfort, irregular heart beat, lower extremity edema, near-syncope, orthopnea, palpitations and paroxysmal nocturnal dyspnea.   Cardiovascular risk factors include advanced age (older than 35 for men, 72 for women), diabetes mellitus, hypertension and male gender.   Wt Readings from Last 3 Encounters:  08/21/15 205 lb (92.987 kg)  07/23/15 202 lb (91.627 kg)  05/30/15 207 lb (93.895 kg)   ------------------------------------------------------------------------ Pt saw Cardiology this morning and visit went well, pt is stable.   Pt grieving since the death of his wife 1 year ago. He is having some cognitive issues as he has forgotten directions to places he has been going for years. He said he thought this office was on other side of street and he has been going here since 1995.  Diabetes Mellitus Type II, Follow-up:   Lab Results  Component Value Date   HGBA1C 5.6 02/05/2015   HGBA1C 6.2* 03/31/2014    Last seen for diabetes 4 months ago.  Management since then includes none. He reports good compliance with treatment. He is not having side effects.  Current symptoms include none and have been unchanged. Home blood sugar records: not being checked  Episodes of hypoglycemia? no   Current Insulin Regimen: n/a Most  Recent Eye Exam: about 6 months ago. Weight trend: stable Pertinent Labs:    Component Value Date/Time   CHOL 168 04/12/2015 0851   CHOL 190 03/31/2014   TRIG 64 04/12/2015 0851   HDL 66 04/12/2015 0851   HDL 63 03/31/2014   LDLCALC 89 04/12/2015 0851   LDLCALC 111 03/31/2014   CREATININE 0.98 04/12/2015 0851   CREATININE 1.0 03/31/2014   CREATININE 0.96 10/13/2012 1035    Wt Readings from Last 3 Encounters:  08/21/15 205 lb (92.987 kg)  07/23/15 202 lb (91.627 kg)  05/30/15 207 lb (93.895 kg)    ------------------------------------------------------------------------     Prior to Admission medications   Medication Sig Start Date End Date Taking? Authorizing Provider  Cholecalciferol (VITAMIN D) 2000 units CAPS Take 1 capsule (2,000 Units total) by mouth daily. 05/30/15  Yes Adline Potter, MD  dabigatran (PRADAXA) 150 MG CAPS Take 150 mg by mouth 2 (two) times daily.   Yes Historical Provider, MD  dofetilide (TIKOSYN) 500 MCG capsule Take 500 mcg by mouth 2 (two) times daily.   Yes Historical Provider, MD  latanoprost (XALATAN) 0.005 % ophthalmic solution Place 1 drop into both eyes daily.   Yes Historical Provider, MD  lisinopril (PRINIVIL,ZESTRIL) 10 MG tablet TAKE 1 TABLET EVERY DAY 11/16/14  Yes Jerrol Banana., MD  metoprolol tartrate (LOPRESSOR) 25 MG tablet Take 25 mg by mouth 2 (two) times daily.   Yes Historical Provider, MD  Psyllium 48.57 % POWD Take by mouth. 08/26/11  Yes Historical Provider, MD    Patient Active Problem List   Diagnosis Date  Noted  . Alcohol abuse 06/14/2015  . Vitamin D deficiency 05/30/2015  . Hepatic steatosis 05/30/2015  . Sick sinus syndrome ([place]) 05/03/2015  . Hx of valvular heart disease 05/03/2015  . Prediabetes 05/03/2015  . Long term current use of antiarrhythmic drug 05/03/2015  . Long term current use of anticoagulant 05/03/2015  . Glaucoma 05/03/2015  . Colon polyp 08/07/2014  . Hypertension 08/07/2014  .  Thrombocytopenia ([place]) 08/07/2014  . Obstructive apnea 05/03/2014  . Ventral hernia 10/04/2012  . History of major vascular surgery 08/11/2011  . Artificial cardiac pacemaker 07/17/2011  . AF (paroxysmal atrial fibrillation) ([place]) 01/02/2011    Past Medical History  Diagnosis Date  . A-fib ([place])   . Atrial fibrillation ([place]) 10/05/2012  . Dysrhythmia   . Coronary artery disease   . Hypercholesterolemia   . Sick sinus syndrome ([place])   . Sleep apnea     Social History   Social History  . Marital Status: Married    Spouse Name: N/A  . Number of Children: N/A  . Years of Education: N/A   Occupational History  . Not on file.   Social History Main Topics  . Smoking status: Former Smoker -- 1.00 packs/day for 19 years  . Smokeless tobacco: Never Used  . Alcohol Use: Yes     Comment: scotch every night  . Drug Use: No  . Sexual Activity: Not on file   Other Topics Concern  . Not on file   Social History Narrative    Allergies  Allergen Reactions  . Sulfa Antibiotics     Unsure of reaction as allergy happened as a child  . Coumadin [Warfarin Sodium] Rash    Review of Systems  Constitutional: Positive for malaise/fatigue.  HENT: Negative.   Eyes: Negative.   Respiratory: Negative.   Cardiovascular: Negative.   Gastrointestinal: Negative.   Genitourinary: Negative.   Musculoskeletal: Negative.   Skin: Negative.   Neurological: Negative.   Endo/Heme/Allergies: Negative.   Psychiatric/Behavioral: Negative.     Immunization History  Administered Date(s) Administered  . Pneumococcal Conjugate-13 03/30/2014  . Pneumococcal Polysaccharide-23 05/04/2001, 06/11/2007  . Td 05/26/2006  . Zoster 04/13/2006, 07/21/2012   Objective:  BP 110/76 mmHg  Pulse 66  Temp(Src) 98 F (36.7 C) (Oral)  Resp 16  Wt 205 lb (92.987 kg)  Physical Exam  Constitutional: He is oriented to person, place, and time and well-developed, well-nourished, and in no distress.  HENT:    Head: Normocephalic and atraumatic.  Right Ear: External ear normal.  Left Ear: External ear normal.  Eyes: Conjunctivae and EOM are normal. Pupils are equal, round, and reactive to light.  Neck: Normal range of motion. Neck supple.  Cardiovascular: Normal rate, regular rhythm, normal heart sounds and intact distal pulses.   Pulmonary/Chest: Effort normal and breath sounds normal.  Abdominal: Soft.  Musculoskeletal: Normal range of motion.  Neurological: He is alert and oriented to person, place, and time. He has normal reflexes. Gait normal. GCS score is 15.  Grossly nonfocal.  Skin: Skin is warm and dry.  Psychiatric: Mood, memory, affect and judgment normal.    Lab Results  Component Value Date   WBC 3.9 04/12/2015   HGB 15.4 09/05/2014   HCT 45.0 04/12/2015   PLT 107* 09/05/2014   GLUCOSE 90 04/12/2015   CHOL 168 04/12/2015   TRIG 64 04/12/2015   HDL 66 04/12/2015   LDLCALC 89 04/12/2015   TSH 2.180 04/12/2015   PSA 0.4 03/31/2014   HGBA1C 5.6  02/05/2015    CMP     Component Value Date/Time   NA 144 04/12/2015 0851   NA 143 10/13/2012 1035   NA 140 07/07/2008 1030   K 4.4 04/12/2015 0851   K 4.4 10/13/2012 1035   CL 103 04/12/2015 0851   CL 110* 10/13/2012 1035   CO2 28 10/13/2012 1035   CO2 30 07/07/2008 1030   GLUCOSE 90 04/12/2015 0851   GLUCOSE 100* 10/13/2012 1035   GLUCOSE 94 07/07/2008 1030   BUN 13 04/12/2015 0851   BUN 18 10/13/2012 1035   BUN 12 07/07/2008 1030   CREATININE 0.98 04/12/2015 0851   CREATININE 1.0 03/31/2014   CREATININE 0.96 10/13/2012 1035   CALCIUM 9.4 04/12/2015 0851   CALCIUM 8.9 10/13/2012 1035   PROT 6.7 04/12/2015 0851   PROT 7.0 07/07/2008 1030   ALBUMIN 4.0 04/12/2015 0851   ALBUMIN 4.1 07/07/2008 1030   AST 76* 04/12/2015 0851   ALT 86* 03/31/2014   ALKPHOS 60 04/12/2015 0851   BILITOT 0.6 04/12/2015 0851   BILITOT 0.8 07/07/2008 1030   GFRNONAA 75 04/12/2015 0851   GFRNONAA >60 10/13/2012 1035   GFRAA 86  04/12/2015 0851   GFRAA >60 10/13/2012 1035    Assessment and Plan :  1. Essential hypertension   2. Hyperlipidemia   3. Type 2 diabetes mellitus without complication, without long-term current use of insulin (HCC)  - POCT HgB A1C--5.8 today. - POCT UA - Microalbumin  4. Coronary artery disease involving native coronary artery of native heart without angina pectoris  5.MCI MMSE later this year.  Patient was seen and examined by Dr. Miguel Aschoff, and noted scribed by Webb Laws, CMA I have done the exam and reviewed the above chart and it is accurate to the best of my knowledge.  Miguel Aschoff MD [facility] 08/21/2015 3:34 PM

## 2015-09-05 DIAGNOSIS — I480 Paroxysmal atrial fibrillation: Secondary | ICD-10-CM | POA: Diagnosis not present

## 2015-09-05 DIAGNOSIS — I482 Chronic atrial fibrillation: Secondary | ICD-10-CM | POA: Diagnosis not present

## 2015-09-05 DIAGNOSIS — Z79899 Other long term (current) drug therapy: Secondary | ICD-10-CM | POA: Diagnosis not present

## 2015-09-05 DIAGNOSIS — Z5181 Encounter for therapeutic drug level monitoring: Secondary | ICD-10-CM | POA: Diagnosis not present

## 2015-09-11 DIAGNOSIS — I480 Paroxysmal atrial fibrillation: Secondary | ICD-10-CM | POA: Diagnosis not present

## 2015-09-12 DIAGNOSIS — H401132 Primary open-angle glaucoma, bilateral, moderate stage: Secondary | ICD-10-CM | POA: Diagnosis not present

## 2015-10-17 ENCOUNTER — Ambulatory Visit
Admission: RE | Admit: 2015-10-17 | Discharge: 2015-10-17 | Disposition: A | Discharge status: Home / Self Care | Payer: Medicare Other | Source: Ambulatory Visit | Attending: Family Medicine | Admitting: Family Medicine

## 2015-10-17 ENCOUNTER — Telehealth: Payer: Self-pay | Admitting: Family Medicine

## 2015-10-17 ENCOUNTER — Ambulatory Visit (INDEPENDENT_AMBULATORY_CARE_PROVIDER_SITE_OTHER): Payer: Medicare Other | Admitting: Family Medicine

## 2015-10-17 ENCOUNTER — Encounter: Payer: Self-pay | Admitting: Family Medicine

## 2015-10-17 VITALS — BP 104/68 | HR 72 | Temp 99.2°F | Resp 14 | Wt 196.0 lb

## 2015-10-17 DIAGNOSIS — I480 Paroxysmal atrial fibrillation: Secondary | ICD-10-CM | POA: Diagnosis not present

## 2015-10-17 DIAGNOSIS — J069 Acute upper respiratory infection, unspecified: Secondary | ICD-10-CM

## 2015-10-17 DIAGNOSIS — E119 Type 2 diabetes mellitus without complications: Secondary | ICD-10-CM

## 2015-10-17 DIAGNOSIS — I2510 Atherosclerotic heart disease of native coronary artery without angina pectoris: Secondary | ICD-10-CM | POA: Diagnosis not present

## 2015-10-17 DIAGNOSIS — R05 Cough: Secondary | ICD-10-CM | POA: Diagnosis not present

## 2015-10-17 NOTE — Patient Instructions (Signed)
Can take OTC Robitussin.

## 2015-10-17 NOTE — Telephone Encounter (Signed)
Pt called back and advised he wioll come in at 1:15/1:30 today/MW

## 2015-10-17 NOTE — Telephone Encounter (Signed)
Now or 1:30 today--thx.

## 2015-10-17 NOTE — Progress Notes (Signed)
Subjective:  HPI  Patient states he has a head cold. Monday July 24th he went to bed feeling fine then woke up at 2:30 am with a sore throat, he did gargle with salt water 3 times and that felt better. He has a head congestion feeling now, had loose stools last night and feels weak, no energy. He took some Benadryl for his symptoms but nothing else.  Prior to Admission medications   Medication Sig Start Date End Date Taking? Authorizing Provider  Cholecalciferol (VITAMIN D) 2000 units CAPS Take 1 capsule (2,000 Units total) by mouth daily. 05/30/15  Yes Adline Potter, MD  dabigatran (PRADAXA) 150 MG CAPS Take 150 mg by mouth 2 (two) times daily.   Yes Historical Provider, MD  dofetilide (TIKOSYN) 500 MCG capsule Take 500 mcg by mouth 2 (two) times daily.   Yes Historical Provider, MD  latanoprost (XALATAN) 0.005 % ophthalmic solution Place 1 drop into both eyes daily.   Yes Historical Provider, MD  lisinopril (PRINIVIL,ZESTRIL) 10 MG tablet TAKE 1 TABLET EVERY DAY 11/16/14  Yes Jerrol Banana., MD  metoprolol tartrate (LOPRESSOR) 25 MG tablet Take 25 mg by mouth 2 (two) times daily.   Yes Historical Provider, MD  Psyllium 48.57 % POWD Take by mouth. 08/26/11  Yes Historical Provider, MD    Patient Active Problem List   Diagnosis Date Noted  . Alcohol abuse 06/14/2015  . Vitamin D deficiency 05/30/2015  . Hepatic steatosis 05/30/2015  . Sick sinus syndrome ([place]) 05/03/2015  . Hx of valvular heart disease 05/03/2015  . Prediabetes 05/03/2015  . Long term current use of antiarrhythmic drug 05/03/2015  . Long term current use of anticoagulant 05/03/2015  . Glaucoma 05/03/2015  . Colon polyp 08/07/2014  . Hypertension 08/07/2014  . Thrombocytopenia ([place]) 08/07/2014  . Obstructive apnea 05/03/2014  . Ventral hernia 10/04/2012  . History of major vascular surgery 08/11/2011  . Artificial cardiac pacemaker 07/17/2011  . AF (paroxysmal atrial fibrillation) ([place]) 01/02/2011    Past  Medical History:  Diagnosis Date  . A-fib ([place])   . Atrial fibrillation ([place]) 10/05/2012  . Coronary artery disease   . Dysrhythmia   . Hypercholesterolemia   . Sick sinus syndrome ([place])   . Sleep apnea     Social History   Social History  . Marital status: Married    Spouse name: N/A  . Number of children: N/A  . Years of education: N/A   Occupational History  . Not on file.   Social History Main Topics  . Smoking status: Former Smoker    Packs/day: 1.00    Years: 19.00  . Smokeless tobacco: Never Used  . Alcohol use Yes     Comment: scotch every night  . Drug use: No  . Sexual activity: Not on file   Other Topics Concern  . Not on file   Social History Narrative  . No narrative on file    Allergies  Allergen Reactions  . Sulfa Antibiotics     Unsure of reaction as allergy happened as a child  . Coumadin [Warfarin Sodium] Rash    Review of Systems  Constitutional: Positive for malaise/fatigue.  HENT: Positive for congestion and sore throat.   Respiratory: Negative.   Cardiovascular: Negative.   Musculoskeletal: Positive for joint pain.  Neurological: Positive for weakness and headaches.    Immunization History  Administered Date(s) Administered  . Pneumococcal Conjugate-13 03/30/2014  . Pneumococcal Polysaccharide-23 05/04/2001, 06/11/2007  . Td 05/26/2006  .  Zoster 04/13/2006, 07/21/2012   Objective:  BP 104/68   Pulse 72   Temp 99.2 F (37.3 C)   Resp 14   Wt 196 lb (88.9 kg) Comment: per patient  SpO2 97%   BMI 25.16 kg/m   Physical Exam  Constitutional: He is oriented to person, place, and time and well-developed, well-nourished, and in no distress.  HENT:  Head: Normocephalic and atraumatic.  Right Ear: External ear normal.  Left Ear: External ear normal.  Nose: Nose normal.  Mouth/Throat: Oropharynx is clear and moist.  Eyes: Conjunctivae are normal. Pupils are equal, round, and reactive to light.  Neck: Normal range of motion.  Neck supple.  Cardiovascular: Normal rate, regular rhythm, normal heart sounds and intact distal pulses.   No murmur heard. Pulmonary/Chest: Effort normal and breath sounds normal. No respiratory distress. He has no wheezes.  Abdominal: Soft. There is no tenderness.  Musculoskeletal: He exhibits no edema, tenderness or deformity.  Neurological: He is alert and oriented to person, place, and time.  Skin: Skin is warm and dry.  Psychiatric: Mood, memory, affect and judgment normal.    Lab Results  Component Value Date   WBC 3.9 04/12/2015   HGB 15.4 09/05/2014   HCT 45.0 04/12/2015   PLT 107 (L) 09/05/2014   GLUCOSE 90 04/12/2015   CHOL 168 04/12/2015   TRIG 64 04/12/2015   HDL 66 04/12/2015   LDLCALC 89 04/12/2015   TSH 2.180 04/12/2015   PSA 0.4 03/31/2014   HGBA1C 5.8 08/21/2015    CMP     Component Value Date/Time   NA 144 04/12/2015 0851   NA 143 10/13/2012 1035   K 4.4 04/12/2015 0851   K 4.4 10/13/2012 1035   CL 103 04/12/2015 0851   CL 110 (H) 10/13/2012 1035   CO2 28 10/13/2012 1035   GLUCOSE 90 04/12/2015 0851   GLUCOSE 100 (H) 10/13/2012 1035   BUN 13 04/12/2015 0851   BUN 18 10/13/2012 1035   CREATININE 0.98 04/12/2015 0851   CREATININE 0.96 10/13/2012 1035   CALCIUM 9.4 04/12/2015 0851   CALCIUM 8.9 10/13/2012 1035   PROT 6.7 04/12/2015 0851   ALBUMIN 4.0 04/12/2015 0851   AST 76 (H) 04/12/2015 0851   ALT 86 (A) 03/31/2014   ALKPHOS 60 04/12/2015 0851   BILITOT 0.6 04/12/2015 0851   GFRNONAA 75 04/12/2015 0851   GFRNONAA >60 10/13/2012 1035   GFRAA 86 04/12/2015 0851   GFRAA >60 10/13/2012 1035    Assessment and Plan :  1. Upper respiratory infection Probably viral illness right now. Push fluids and rest. Will get chest xray to rule out  Pneumonia. Follow as needed. Advised patient to let us know if gets worse or starts running fever. Can try Robitussin. - DG Chest 2 View; Future  2. AF (paroxysmal atrial fibrillation) ([place])  3. Coronary  artery disease involving native coronary artery of native heart without angina pectoris  4. Type 2 diabetes mellitus without complication, without long-term current use of insulin ([place]) I have done the exam and reviewed the above chart and it is accurate to the best of my knowledge.  Patient was seen and examined by Dr. Eulas Post and note was scribed by Theressa Millard, RMA.    Miguel Aschoff MD [facility] 10/17/2015 2:16 PM

## 2015-10-17 NOTE — Telephone Encounter (Signed)
Pt is requesting a work in appointment for today. He has loose stools,fever and states that he feels awful

## 2015-11-05 ENCOUNTER — Telehealth: Payer: Self-pay | Admitting: Emergency Medicine

## 2015-11-05 DIAGNOSIS — J069 Acute upper respiratory infection, unspecified: Secondary | ICD-10-CM

## 2015-11-05 MED ORDER — AMOXICILLIN-POT CLAVULANATE 875-125 MG PO TABS: 1.0000 | ORAL_TABLET | Freq: Two times a day (BID) | ORAL | 0 refills | Status: DC

## 2015-11-05 NOTE — Telephone Encounter (Signed)
Patient advised.  Thanks,  -Joseline 

## 2015-11-05 NOTE — Telephone Encounter (Signed)
Pt was seen on 10/17/15 for URI by Dr. Rosanna Randy and told to try OTC medications for a URI but pt reports that he is not feeling any better and would like to have an antibiotic called into [facility]. Please advise. Dr. Rosanna Randy is out of the office. Thanks.

## 2015-11-05 NOTE — Telephone Encounter (Signed)
Sent augmentin to [facility].

## 2015-12-10 DIAGNOSIS — Z23 Encounter for immunization: Secondary | ICD-10-CM | POA: Diagnosis not present

## 2015-12-11 ENCOUNTER — Other Ambulatory Visit: Payer: Self-pay | Admitting: Family Medicine

## 2015-12-11 DIAGNOSIS — H401132 Primary open-angle glaucoma, bilateral, moderate stage: Secondary | ICD-10-CM | POA: Diagnosis not present

## 2015-12-12 DIAGNOSIS — I480 Paroxysmal atrial fibrillation: Secondary | ICD-10-CM | POA: Diagnosis not present

## 2015-12-12 DIAGNOSIS — I714 Abdominal aortic aneurysm, without rupture: Secondary | ICD-10-CM | POA: Diagnosis not present

## 2015-12-12 DIAGNOSIS — G4733 Obstructive sleep apnea (adult) (pediatric): Secondary | ICD-10-CM | POA: Diagnosis not present

## 2015-12-12 DIAGNOSIS — Z79899 Other long term (current) drug therapy: Secondary | ICD-10-CM | POA: Diagnosis not present

## 2015-12-12 DIAGNOSIS — Z5181 Encounter for therapeutic drug level monitoring: Secondary | ICD-10-CM | POA: Diagnosis not present

## 2015-12-18 ENCOUNTER — Encounter: Payer: Self-pay | Admitting: Family Medicine

## 2015-12-18 ENCOUNTER — Ambulatory Visit (INDEPENDENT_AMBULATORY_CARE_PROVIDER_SITE_OTHER): Payer: Medicare Other | Admitting: Family Medicine

## 2015-12-18 VITALS — BP 124/76 | HR 68 | Temp 98.1°F | Resp 14 | Wt 198.0 lb

## 2015-12-18 DIAGNOSIS — I2510 Atherosclerotic heart disease of native coronary artery without angina pectoris: Secondary | ICD-10-CM

## 2015-12-18 DIAGNOSIS — I10 Essential (primary) hypertension: Secondary | ICD-10-CM

## 2015-12-18 DIAGNOSIS — R739 Hyperglycemia, unspecified: Secondary | ICD-10-CM

## 2015-12-18 DIAGNOSIS — E785 Hyperlipidemia, unspecified: Secondary | ICD-10-CM

## 2015-12-18 DIAGNOSIS — R410 Disorientation, unspecified: Secondary | ICD-10-CM

## 2015-12-18 LAB — POCT GLYCOSYLATED HEMOGLOBIN (HGB A1C): HEMOGLOBIN A1C: 5.5

## 2015-12-18 NOTE — Progress Notes (Signed)
Subjective:  HPI  Pre-Diabetes, Follow-up:   Lab Results  Component Value Date   HGBA1C 5.8 08/21/2015   HGBA1C 5.6 02/05/2015   HGBA1C 6.2 (A) 03/31/2014      Current exercise: walking and weightlifting almost daily  Pertinent Labs:    Component Value Date/Time   CHOL 168 04/12/2015 0851   TRIG 64 04/12/2015 0851   HDL 66 04/12/2015 0851   LDLCALC 89 04/12/2015 0851   CREATININE 0.98 04/12/2015 0851   CREATININE 0.96 10/13/2012 1035    Wt Readings from Last 3 Encounters:  12/18/15 198 lb (89.8 kg)  10/17/15 196 lb (88.9 kg)  08/21/15 205 lb (93 kg)    ------------------------------------------------------------------------  Hypertension, follow-up:  BP Readings from Last 3 Encounters:  12/18/15 124/76  10/17/15 104/68  08/21/15 110/76    He was last seen for hypertension 4 months ago.  BP at that visit was 104/88. Management since that visit includes none. He reports good compliance with treatment. He is not having side effects.  He is exercising. He is adherent to low salt diet.   Outside blood pressures are not being checked. He is experiencing none.  Patient denies chest pain, chest pressure/discomfort, claudication, dyspnea, exertional chest pressure/discomfort, fatigue, irregular heart beat, lower extremity edema, near-syncope, orthopnea, palpitations, paroxysmal nocturnal dyspnea, syncope and tachypnea.   Wt Readings from Last 3 Encounters:  12/18/15 198 lb (89.8 kg)  10/17/15 196 lb (88.9 kg)  08/21/15 205 lb (93 kg)   ------------------------------------------------------------------------    Pt is here today for his normal 3 month follow up. He has also been having periods of confusion. " I just don't know where I am" He went to a [facility] ball game and has been parking in the same parking lot for 30+ years and could not find it, he was not exactly lost but just could not remember where the parking lot was. He had a similar episode before his  last OV when he went to the hospital look for Dr. Alben Spittle office to be there. Pt reports that this is starting to become more frequent and this about the parking lot really scared him. He does not have any memory problems any other times, it is just episodes. Denies headache, dizziness, weakness, blurred vision. Etc. It does not happen every day, it has happened about 5 times total, but it is just starting to scare pt.   Prior to Admission medications   Medication Sig Start Date End Date Taking? Authorizing Provider  Cholecalciferol (VITAMIN D) 2000 units CAPS Take 1 capsule (2,000 Units total) by mouth daily. 05/30/15  Yes Adline Potter, MD  dabigatran (PRADAXA) 150 MG CAPS Take 150 mg by mouth 2 (two) times daily.   Yes Historical Provider, MD  dofetilide (TIKOSYN) 500 MCG capsule Take 500 mcg by mouth 2 (two) times daily.   Yes Historical Provider, MD  latanoprost (XALATAN) 0.005 % ophthalmic solution Place 1 drop into both eyes daily.   Yes Historical Provider, MD  lisinopril (PRINIVIL,ZESTRIL) 10 MG tablet TAKE 1 TABLET EVERY DAY 12/11/15  Yes Grover Robinson Maceo Pro., MD  metoprolol tartrate (LOPRESSOR) 25 MG tablet Take 25 mg by mouth 2 (two) times daily.   Yes Historical Provider, MD  amoxicillin-clavulanate (AUGMENTIN) 875-125 MG tablet Take 1 tablet by mouth 2 (two) times daily. Patient not taking: Reported on 12/18/2015 11/05/15   Clearnce Sorrel Burnette, PA-C  Psyllium 48.57 % POWD Take by mouth. 08/26/11   Historical Provider, MD    Patient Active  Problem List   Diagnosis Date Noted  . Alcohol abuse 06/14/2015  . Vitamin D deficiency 05/30/2015  . Hepatic steatosis 05/30/2015  . Sick sinus syndrome ([place]) 05/03/2015  . Hx of valvular heart disease 05/03/2015  . Prediabetes 05/03/2015  . Long term current use of antiarrhythmic drug 05/03/2015  . Long term current use of anticoagulant 05/03/2015  . Glaucoma 05/03/2015  . Colon polyp 08/07/2014  . Hypertension 08/07/2014  . Thrombocytopenia  ([place]) 08/07/2014  . Obstructive apnea 05/03/2014  . Ventral hernia 10/04/2012  . History of major vascular surgery 08/11/2011  . Artificial cardiac pacemaker 07/17/2011  . AF (paroxysmal atrial fibrillation) ([place]) 01/02/2011    Past Medical History:  Diagnosis Date  . A-fib ([place])   . Atrial fibrillation ([place]) 10/05/2012  . Coronary artery disease   . Dysrhythmia   . Hypercholesterolemia   . Sick sinus syndrome ([place])   . Sleep apnea     Social History   Social History  . Marital status: Married    Spouse name: N/A  . Number of children: N/A  . Years of education: N/A   Occupational History  . Not on file.   Social History Main Topics  . Smoking status: Former Smoker    Packs/day: 1.00    Years: 19.00  . Smokeless tobacco: Never Used  . Alcohol use Yes     Comment: scotch every night  . Drug use: No  . Sexual activity: Not on file   Other Topics Concern  . Not on file   Social History Narrative  . No narrative on file    Allergies  Allergen Reactions  . Sulfa Antibiotics     Unsure of reaction as allergy happened as a child  . Coumadin [Warfarin Sodium] Rash    Review of Systems  Constitutional: Negative.   HENT: Negative.   Eyes: Negative.   Respiratory: Negative.   Cardiovascular: Negative.   Gastrointestinal: Negative.   Musculoskeletal: Negative.   Skin: Negative.   Neurological: Negative.   Endo/Heme/Allergies: Negative.   Psychiatric/Behavioral: Positive for memory loss.    Immunization History  Administered Date(s) Administered  . Pneumococcal Conjugate-13 03/30/2014  . Pneumococcal Polysaccharide-23 05/04/2001, 06/11/2007  . Td 05/26/2006  . Zoster 04/13/2006, 07/21/2012   Objective:  BP 124/76 (BP Location: Left Arm, Patient Position: Sitting, Cuff Size: Normal)   Pulse 68   Temp 98.1 F (36.7 C) (Oral)   Resp 14   Wt 198 lb (89.8 kg)   BMI 25.42 kg/m   Physical Exam  Constitutional: He is oriented to person, place, and time  and well-developed, well-nourished, and in no distress.  HENT:  Head: Normocephalic and atraumatic.  Right Ear: External ear normal.  Left Ear: External ear normal.  Nose: Nose normal.  Mouth/Throat: Oropharynx is clear and moist.  Eyes: Conjunctivae and EOM are normal. Pupils are equal, round, and reactive to light.  Neck: Normal range of motion. Neck supple.  Cardiovascular: Normal rate, regular rhythm, normal heart sounds and intact distal pulses.   Pulmonary/Chest: Effort normal and breath sounds normal.  Abdominal: Soft.  Musculoskeletal: Normal range of motion.  Neurological: He is alert and oriented to person, place, and time. He has normal reflexes. Gait normal. GCS score is 15.  Skin: Skin is warm and dry.  Psychiatric: Mood, affect and judgment normal.    Lab Results  Component Value Date   WBC 3.9 04/12/2015   HGB 15.4 09/05/2014   HCT 45.0 04/12/2015   PLT 107 (L) 09/05/2014  GLUCOSE 90 04/12/2015   CHOL 168 04/12/2015   TRIG 64 04/12/2015   HDL 66 04/12/2015   LDLCALC 89 04/12/2015   TSH 2.180 04/12/2015   PSA 0.4 03/31/2014   HGBA1C 5.8 08/21/2015   MICROALBUR 50 08/21/2015    CMP     Component Value Date/Time   NA 144 04/12/2015 0851   NA 143 10/13/2012 1035   K 4.4 04/12/2015 0851   K 4.4 10/13/2012 1035   CL 103 04/12/2015 0851   CL 110 (H) 10/13/2012 1035   CO2 28 10/13/2012 1035   GLUCOSE 90 04/12/2015 0851   GLUCOSE 100 (H) 10/13/2012 1035   BUN 13 04/12/2015 0851   BUN 18 10/13/2012 1035   CREATININE 0.98 04/12/2015 0851   CREATININE 0.96 10/13/2012 1035   CALCIUM 9.4 04/12/2015 0851   CALCIUM 8.9 10/13/2012 1035   PROT 6.7 04/12/2015 0851   ALBUMIN 4.0 04/12/2015 0851   AST 76 (H) 04/12/2015 0851   ALT 86 (A) 03/31/2014   ALKPHOS 60 04/12/2015 0851   BILITOT 0.6 04/12/2015 0851   GFRNONAA 75 04/12/2015 0851   GFRNONAA >60 10/13/2012 1035   GFRAA 86 04/12/2015 0851   GFRAA >60 10/13/2012 1035    Assessment and Plan :  1.  Essential hypertension  - CBC with Differential/Platelet - TSH  2. Hyperlipidemia  - Comprehensive metabolic panel  3. Hyperglycemia  - POCT HgB A1C 5.5 today   4. Delirium Episodic memory loss.  - Ambulatory referral to Neurology 5. Mild cognitive impairment I think he probably has MCI versus delirium. 6. Adjustment reaction/mild depression Early this could be contributing to his depression. I think putting him on a low-dose sertraline or citalopram might help. HPI, Exam, and A&P Transcribed under the direction and in the presence of Lyanne Kates L. [place] Mon, MD  Electronically Signed: Webb Laws, [place] MD [facility] 12/18/2015 3:17 PM

## 2015-12-19 ENCOUNTER — Telehealth: Payer: Self-pay

## 2015-12-19 DIAGNOSIS — H401132 Primary open-angle glaucoma, bilateral, moderate stage: Secondary | ICD-10-CM | POA: Diagnosis not present

## 2015-12-19 LAB — COMPREHENSIVE METABOLIC PANEL
ALT: 44 IU/L (ref 0–44)
AST: 52 IU/L — AB (ref 0–40)
Albumin/Globulin Ratio: 1.9 (ref 1.2–2.2)
Albumin: 4.5 g/dL (ref 3.5–4.8)
Alkaline Phosphatase: 83 IU/L (ref 39–117)
BUN/Creatinine Ratio: 13 (ref 10–24)
BUN: 17 mg/dL (ref 8–27)
Bilirubin Total: 0.8 mg/dL (ref 0.0–1.2)
CALCIUM: 9.6 mg/dL (ref 8.6–10.2)
CHLORIDE: 99 mmol/L (ref 96–106)
CO2: 27 mmol/L (ref 18–29)
Creatinine, Ser: 1.28 mg/dL — ABNORMAL HIGH (ref 0.76–1.27)
GFR, EST AFRICAN AMERICAN: 62 mL/min/{1.73_m2} (ref >59)
GFR, EST NON AFRICAN AMERICAN: 54 mL/min/{1.73_m2} — AB (ref >59)
GLUCOSE: 87 mg/dL (ref 65–99)
Globulin, Total: 2.4 g/dL (ref 1.5–4.5)
POTASSIUM: 4.5 mmol/L (ref 3.5–5.2)
Sodium: 143 mmol/L (ref 134–144)
TOTAL PROTEIN: 6.9 g/dL (ref 6.0–8.5)

## 2015-12-19 LAB — CBC WITH DIFFERENTIAL/PLATELET
BASOS ABS: 0 10*3/uL (ref 0.0–0.2)
Basos: 0 %
EOS (ABSOLUTE): 0.3 10*3/uL (ref 0.0–0.4)
Eos: 6 %
HEMOGLOBIN: 15.2 g/dL (ref 12.6–17.7)
Hematocrit: 43 % (ref 37.5–51.0)
IMMATURE GRANS (ABS): 0 10*3/uL (ref 0.0–0.1)
IMMATURE GRANULOCYTES: 0 %
LYMPHS: 27 %
Lymphocytes Absolute: 1.5 10*3/uL (ref 0.7–3.1)
MCH: 34.7 pg — ABNORMAL HIGH (ref 26.6–33.0)
MCHC: 35.3 g/dL (ref 31.5–35.7)
MCV: 98 fL — ABNORMAL HIGH (ref 79–97)
MONOCYTES: 10 %
Monocytes Absolute: 0.6 10*3/uL (ref 0.1–0.9)
NEUTROS ABS: 3.1 10*3/uL (ref 1.4–7.0)
NEUTROS PCT: 57 %
PLATELETS: 112 10*3/uL — AB (ref 150–379)
RBC: 4.38 x10E6/uL (ref 4.14–5.80)
RDW: 14.3 % (ref 12.3–15.4)
WBC: 5.5 10*3/uL (ref 3.4–10.8)

## 2015-12-19 LAB — TSH: TSH: 1.85 u[IU]/mL (ref 0.450–4.500)

## 2015-12-19 NOTE — Telephone Encounter (Signed)
-----   Message from Jerrol Banana., MD sent at 12/19/2015  8:31 AM EDT ----- Labs presently in normal range.

## 2015-12-19 NOTE — Telephone Encounter (Signed)
Pt advised.   Thanks,   -Laura  

## 2016-01-10 ENCOUNTER — Telehealth: Payer: Self-pay | Admitting: Family Medicine

## 2016-01-10 NOTE — Telephone Encounter (Signed)
Catlyn with [facility] called stating [place] and is requesting a Rx in pill form for the Typhoid and Malaria vaccines.  [facility]  [address].  [phone]

## 2016-01-10 NOTE — Telephone Encounter (Signed)
Please review-aa 

## 2016-01-21 NOTE — Telephone Encounter (Signed)
Please review, this was on green chart like 2 weeks ago or so-aa

## 2016-01-21 NOTE — Telephone Encounter (Signed)
Catlyn from [facility] called again to check on the Rx for vaccines and medication. CB# [phone]

## 2016-01-25 ENCOUNTER — Telehealth: Payer: Self-pay | Admitting: Family Medicine

## 2016-01-25 ENCOUNTER — Other Ambulatory Visit: Payer: Self-pay | Admitting: Neurology

## 2016-01-25 DIAGNOSIS — R413 Other amnesia: Secondary | ICD-10-CM

## 2016-01-25 NOTE — Telephone Encounter (Signed)
Please review, there have been faxes about this also-aa

## 2016-01-25 NOTE — Telephone Encounter (Signed)
Pt is going to [place] Wednesday and a prescription   for Malaria.  He would like for you to send an order to [facility]./  His call back is [phone]  Thanks Con Memos

## 2016-01-26 NOTE — Telephone Encounter (Signed)
Went by pharmacy and took care of this for pt.

## 2016-01-29 ENCOUNTER — Ambulatory Visit
Admission: RE | Admit: 2016-01-29 | Discharge: 2016-01-29 | Disposition: A | Discharge status: Home / Self Care | Payer: Medicare Other | Source: Ambulatory Visit | Attending: Neurology | Admitting: Neurology

## 2016-01-29 DIAGNOSIS — R413 Other amnesia: Secondary | ICD-10-CM

## 2016-02-18 DIAGNOSIS — Z85828 Personal history of other malignant neoplasm of skin: Secondary | ICD-10-CM | POA: Diagnosis not present

## 2016-02-18 DIAGNOSIS — L570 Actinic keratosis: Secondary | ICD-10-CM | POA: Diagnosis not present

## 2016-02-18 DIAGNOSIS — D692 Other nonthrombocytopenic purpura: Secondary | ICD-10-CM | POA: Diagnosis not present

## 2016-02-18 DIAGNOSIS — L821 Other seborrheic keratosis: Secondary | ICD-10-CM | POA: Diagnosis not present

## 2016-02-18 DIAGNOSIS — L812 Freckles: Secondary | ICD-10-CM | POA: Diagnosis not present

## 2016-02-18 DIAGNOSIS — L578 Other skin changes due to chronic exposure to nonionizing radiation: Secondary | ICD-10-CM | POA: Diagnosis not present

## 2016-02-26 DIAGNOSIS — G4733 Obstructive sleep apnea (adult) (pediatric): Secondary | ICD-10-CM | POA: Diagnosis not present

## 2016-02-26 DIAGNOSIS — Z8679 Personal history of other diseases of the circulatory system: Secondary | ICD-10-CM | POA: Diagnosis not present

## 2016-02-26 DIAGNOSIS — G3184 Mild cognitive impairment, so stated: Secondary | ICD-10-CM | POA: Diagnosis not present

## 2016-02-26 DIAGNOSIS — Z9989 Dependence on other enabling machines and devices: Secondary | ICD-10-CM | POA: Diagnosis not present

## 2016-02-27 DIAGNOSIS — H401132 Primary open-angle glaucoma, bilateral, moderate stage: Secondary | ICD-10-CM | POA: Diagnosis not present

## 2016-02-28 DIAGNOSIS — I480 Paroxysmal atrial fibrillation: Secondary | ICD-10-CM | POA: Diagnosis not present

## 2016-03-04 DIAGNOSIS — I714 Abdominal aortic aneurysm, without rupture: Secondary | ICD-10-CM | POA: Diagnosis not present

## 2016-03-12 DIAGNOSIS — I351 Nonrheumatic aortic (valve) insufficiency: Secondary | ICD-10-CM | POA: Diagnosis not present

## 2016-03-12 DIAGNOSIS — I714 Abdominal aortic aneurysm, without rupture: Secondary | ICD-10-CM | POA: Diagnosis not present

## 2016-03-12 DIAGNOSIS — I10 Essential (primary) hypertension: Secondary | ICD-10-CM | POA: Diagnosis not present

## 2016-03-12 DIAGNOSIS — I480 Paroxysmal atrial fibrillation: Secondary | ICD-10-CM | POA: Diagnosis not present

## 2016-04-16 ENCOUNTER — Ambulatory Visit: Payer: Medicare Other | Admitting: Family Medicine

## 2016-04-18 DIAGNOSIS — Z0000 Encounter for general adult medical examination without abnormal findings: Secondary | ICD-10-CM | POA: Diagnosis not present

## 2016-04-23 ENCOUNTER — Ambulatory Visit (INDEPENDENT_AMBULATORY_CARE_PROVIDER_SITE_OTHER): Payer: Medicare Other

## 2016-04-23 ENCOUNTER — Ambulatory Visit (INDEPENDENT_AMBULATORY_CARE_PROVIDER_SITE_OTHER): Payer: Medicare Other | Admitting: Family Medicine

## 2016-04-23 VITALS — BP 140/72 | HR 72 | Temp 97.8°F | Resp 16 | Wt 195.0 lb

## 2016-04-23 VITALS — BP 140/72 | HR 72 | Temp 97.8°F | Ht 74.0 in | Wt 195.0 lb

## 2016-04-23 DIAGNOSIS — S30811A Abrasion of abdominal wall, initial encounter: Secondary | ICD-10-CM

## 2016-04-23 DIAGNOSIS — I10 Essential (primary) hypertension: Secondary | ICD-10-CM | POA: Diagnosis not present

## 2016-04-23 DIAGNOSIS — Z0000 Encounter for general adult medical examination without abnormal findings: Secondary | ICD-10-CM | POA: Diagnosis not present

## 2016-04-23 DIAGNOSIS — I2510 Atherosclerotic heart disease of native coronary artery without angina pectoris: Secondary | ICD-10-CM | POA: Insufficient documentation

## 2016-04-23 DIAGNOSIS — E7849 Other hyperlipidemia: Secondary | ICD-10-CM

## 2016-04-23 DIAGNOSIS — G3184 Mild cognitive impairment, so stated: Secondary | ICD-10-CM | POA: Diagnosis not present

## 2016-04-23 DIAGNOSIS — Z23 Encounter for immunization: Secondary | ICD-10-CM | POA: Diagnosis not present

## 2016-04-23 DIAGNOSIS — E784 Other hyperlipidemia: Secondary | ICD-10-CM | POA: Diagnosis not present

## 2016-04-23 MED ORDER — DONEPEZIL HCL 5 MG PO TABS: 5.0000 mg | ORAL_TABLET | Freq: Every day | ORAL | 12 refills | Status: DC

## 2016-04-23 NOTE — Progress Notes (Signed)
Jeremy Kane  MRN: [id number] DOB: [birth year]  Subjective:  HPI  Patient is here for 4 months follow up. He saw McKenzie earlier today for Wellness Visit. He checks his b/p sometimes and usually readings are 100/74. No cardiac symptoms. BP Readings from Last 3 Encounters:  04/23/16 140/72  04/23/16 140/72  12/18/15 124/76   He was referred to neurology on his last visit due to memory issues. He saw Dr Trena Platt Nurse practitioner. He had CT head done. Office visit was in December 2017. Per her note it said offered medication for memory but patient does not recall this and is not on any memory medications. He was advised that he possibly has an early onset Alzheimer's disease. Memory is worsening patient can tell. Example: he has trouble finding how to get to a place that he has been to before many times like our office today. NO speech difficulties. 6CIT was 4. SLUM memory test at Neurologist office was done in November and score was 26/30. Patient Active Problem List   Diagnosis Date Noted  . Alcohol abuse 06/14/2015  . Vitamin D deficiency 05/30/2015  . Hepatic steatosis 05/30/2015  . Sick sinus syndrome ([place]) 05/03/2015  . Hx of valvular heart disease 05/03/2015  . Prediabetes 05/03/2015  . Long term current use of antiarrhythmic drug 05/03/2015  . Long term current use of anticoagulant 05/03/2015  . Glaucoma 05/03/2015  . Colon polyp 08/07/2014  . Hypertension 08/07/2014  . Thrombocytopenia ([place]) 08/07/2014  . Obstructive apnea 05/03/2014  . Ventral hernia 10/04/2012  . History of major vascular surgery 08/11/2011  . Artificial cardiac pacemaker 07/17/2011  . AF (paroxysmal atrial fibrillation) ([place]) 01/02/2011    Past Medical History:  Diagnosis Date  . A-fib ([place])   . Atrial fibrillation ([place]) 10/05/2012  . Coronary artery disease   . Dysrhythmia   . Hypercholesterolemia   . Hypertension   . Sick sinus syndrome ([place])   . Sleep apnea     Social History    Social History  . Marital status: Widowed    Spouse name: N/A  . Number of children: N/A  . Years of education: N/A   Occupational History  . Not on file.   Social History Main Topics  . Smoking status: Former Smoker    Packs/day: 1.00    Years: 19.00    Types: Cigarettes  . Smokeless tobacco: Never Used     Comment: > 35 years since quit  . Alcohol use 16.8 oz/week    28 Shots of liquor per week  . Drug use: No  . Sexual activity: Not on file   Other Topics Concern  . Not on file   Social History Narrative  . No narrative on file    Outpatient Encounter Prescriptions as of 04/23/2016  Medication Sig Note  . Cholecalciferol (VITAMIN D) 2000 units CAPS Take 1 capsule (2,000 Units total) by mouth daily.   . dabigatran (PRADAXA) 150 MG CAPS Take 150 mg by mouth 2 (two) times daily.   Marland Kitchen dofetilide (TIKOSYN) 500 MCG capsule Take 500 mcg by mouth 2 (two) times daily.   Marland Kitchen latanoprost (XALATAN) 0.005 % ophthalmic solution Place 1 drop into both eyes daily.   Marland Kitchen lisinopril (PRINIVIL,ZESTRIL) 10 MG tablet TAKE 1 TABLET EVERY DAY   . metoprolol tartrate (LOPRESSOR) 25 MG tablet Take 25 mg by mouth 2 (two) times daily.   . Psyllium 48.57 % POWD Take by mouth. 08/07/2014: Received from: [facility]  . [DISCONTINUED] amoxicillin-clavulanate (  AUGMENTIN) 875-125 MG tablet Take 1 tablet by mouth 2 (two) times daily. (Patient not taking: Reported on 12/18/2015)    No facility-administered encounter medications on file as of 04/23/2016.     Allergies  Allergen Reactions  . Sulfa Antibiotics     Unsure of reaction as allergy happened as a child  . Coumadin [Warfarin Sodium] Rash    Review of Systems  Constitutional: Negative.   Respiratory: Negative.   Cardiovascular: Negative.   Gastrointestinal: Negative.   Neurological: Negative.   Psychiatric/Behavioral: Positive for memory loss.    Objective:  BP 140/72   Pulse 72   Temp 97.8 F (36.6 C)   Resp 16   Wt  195 lb (88.5 kg)   BMI 25.04 kg/m   Physical Exam  Constitutional: He is well-developed, well-nourished, and in no distress.  HENT:  Head: Normocephalic and atraumatic.  Eyes: Conjunctivae are normal. Pupils are equal, round, and reactive to light.  Neck: Normal range of motion. Neck supple.  Cardiovascular: Normal rate, regular rhythm, normal heart sounds and intact distal pulses.   No murmur heard. Pulmonary/Chest: Effort normal and breath sounds normal. No respiratory distress. He has no wheezes.  Neurological: He is alert.  Skin:  Abrasion on the abdomen from rash and patient scratching it    Assessment and Plan :  1. Essential hypertension Stable.  2. Other hyperlipidemia 3. Coronary artery disease involving native coronary artery of native heart without angina pectoris  4. MCI (mild cognitive impairment) with memory loss Worsening, start Donepezil 5 mg.  6. Abrasion of abdominal wall, initial encounter Abrasion on the abdomen from rash and patient scratching it. 7.Mild Depression Consider sertraline on next OV.  Td updated today in the office HPI, Exam and A&P transcribed under direction and in the presence of Miguel Aschoff, MD. I have done the exam and reviewed the chart and it is accurate to the best of my knowledge. Development worker, community has been used and  any errors in dictation or transcription are unintentional. Miguel Aschoff M.D. [facility]

## 2016-04-23 NOTE — Patient Instructions (Signed)

## 2016-04-23 NOTE — Progress Notes (Signed)
Subjective:   Jeremy Kane is a 76 y.o. male who presents for Medicare Annual/Subsequent preventive examination.  Review of Systems:  N/A  Cardiac Risk Factors include: advanced age (>1men, >20 women);hypertension;male gender;sedentary lifestyle     Objective:    Vitals: BP 140/72 (BP Location: Right Arm)   Pulse 72   Temp 97.8 F (36.6 C) (Oral)   Ht 6\' 2"  (1.88 m)   Wt 195 lb (88.5 kg)   BMI 25.04 kg/m   Body mass index is 25.04 kg/m.  Tobacco History  Smoking Status  . Former Smoker  . Packs/day: 1.00  . Years: 19.00  . Types: Cigarettes  Smokeless Tobacco  . Never Used    Comment: > 35 years since quit     Counseling given: Not Answered   Past Medical History:  Diagnosis Date  . A-fib ([place])   . Atrial fibrillation ([place]) 10/05/2012  . Coronary artery disease   . Dysrhythmia   . Hypercholesterolemia   . Hypertension   . Sick sinus syndrome ([place])   . Sleep apnea    Past Surgical History:  Procedure Laterality Date  . ANAL FISSURE REPAIR    . AORTIC VALVE REPAIR    . CARDIAC SURGERY  2013  . CATARACT EXTRACTION    . COLONOSCOPY WITH PROPOFOL N/A 07/23/2015   Procedure: COLONOSCOPY WITH PROPOFOL;  Surgeon: Lollie Sails, MD;  Location: [facility] ENDOSCOPY;  Service: Endoscopy;  Laterality: N/A;  . HERNIA REPAIR  [id number]   umbilical  . OLECRANON BURSECTOMY Left 09/08/2014   Procedure: OLECRANON BURSA;  Surgeon: Christophe Louis, MD;  Location: [facility] ORS;  Service: Orthopedics;  Laterality: Left;  . PACEMAKER INSERTION  2013  . TONSILLECTOMY     Family History  Problem Relation Age of Onset  . Alzheimer's disease Mother   . Heart disease Mother   . Alcohol abuse Father   . Prostate cancer Father   . HIV/AIDS Brother    History  Sexual Activity  . Sexual activity: Not on file    Outpatient Encounter Prescriptions as of 04/23/2016  Medication Sig  . Cholecalciferol (VITAMIN D) 2000 units CAPS Take 1 capsule (2,000 Units total) by mouth daily.  .  dabigatran (PRADAXA) 150 MG CAPS Take 150 mg by mouth 2 (two) times daily.  Marland Kitchen dofetilide (TIKOSYN) 500 MCG capsule Take 500 mcg by mouth 2 (two) times daily.  Marland Kitchen latanoprost (XALATAN) 0.005 % ophthalmic solution Place 1 drop into both eyes daily.  Marland Kitchen lisinopril (PRINIVIL,ZESTRIL) 10 MG tablet TAKE 1 TABLET EVERY DAY  . metoprolol tartrate (LOPRESSOR) 25 MG tablet Take 25 mg by mouth 2 (two) times daily.  . Psyllium 48.57 % POWD Take by mouth.  Marland Kitchen amoxicillin-clavulanate (AUGMENTIN) 875-125 MG tablet Take 1 tablet by mouth 2 (two) times daily. (Patient not taking: Reported on 12/18/2015)   No facility-administered encounter medications on file as of 04/23/2016.     Activities of Daily Living In your present state of health, do you have any difficulty performing the following activities: 04/23/2016  Hearing? N  Vision? N  Difficulty concentrating or making decisions? Y  Walking or climbing stairs? N  Dressing or bathing? N  Doing errands, shopping? N  Preparing Food and eating ? N  Using the Toilet? N  In the past six months, have you accidently leaked urine? N  Do you have problems with loss of bowel control? N  Managing your Medications? N  Managing your Finances? N  Housekeeping or managing your Housekeeping?  N  Some recent data might be hidden    Patient Care Team: Jerrol Banana., MD as PCP - General (Family Medicine) Ronnell Freshwater, MD as Referring Physician (Ophthalmology) Corey Skains, MD as Consulting Physician (Cardiology) Bobbie Stack, MD as Referring Physician (Surgery) Lang Snow, NP as Nurse Practitioner (Neurology)   Assessment:     Exercise Activities and Dietary recommendations Current Exercise Habits: Structured exercise class, Type of exercise: stretching;treadmill;strength training/weights, Time (Minutes): 45, Frequency (Times/Week): 5, Weekly Exercise (Minutes/Week): 225  Goals    . Increase water intake          Starting  04/23/16, I will increase my water intake to 5 glasses a day.      Fall Risk Fall Risk  04/23/2016 04/11/2015  Falls in the past year? No No   Depression Screen PHQ 2/9 Scores 04/23/2016 04/11/2015  PHQ - 2 Score 0 0    Cognitive Function     6CIT Screen 04/23/2016  What Year? 0 points  What month? 0 points  What time? 0 points  Count back from 20 0 points  Months in reverse 2 points  Repeat phrase 2 points  Total Score 4    Immunization History  Administered Date(s) Administered  . Pneumococcal Conjugate-13 03/30/2014  . Pneumococcal Polysaccharide-23 05/04/2001, 06/11/2007  . Td 05/26/2006  . Zoster 04/13/2006, 07/21/2012   Screening Tests Health Maintenance  Topic Date Due  . FOOT EXAM  03/24/2017 (Originally 02/14/1951)  . TETANUS/TDAP  05/25/2016  . HEMOGLOBIN A1C  06/16/2016  . OPHTHALMOLOGY EXAM  08/22/2016  . INFLUENZA VACCINE  Completed  . ZOSTAVAX  Completed  . PNA vac Low Risk Adult  Completed      Plan:  I have personally reviewed and addressed the Medicare Annual Wellness questionnaire and have noted the following in the patient's chart:  A. Medical and social history B. Use of alcohol, tobacco or illicit drugs  C. Current medications and supplements D. Functional ability and status E.  Nutritional status F.  Physical activity G. Advance directives H. List of other physicians I.  Hospitalizations, surgeries, and ER visits in previous 12 months J.  [place] such as hearing and vision if needed, cognitive and depression L. Referrals and appointments - none  In addition, I have reviewed and discussed with patient certain preventive protocols, quality metrics, and best practice recommendations. A written personalized care plan for preventive services as well as general preventive health recommendations were provided to patient.  See attached scanned questionnaire for additional information.   Signed,  Fabio Neighbors, LPN Nurse Health  Advisor   MD Recommendations: Need for diabetic foot exam? Currently postponed until 03/2017. I have reviewed the health advisors note, was  available for consultation and I agree with documentation and plan. Miguel Aschoff MD [facility]

## 2016-05-27 ENCOUNTER — Encounter: Payer: Self-pay | Admitting: Family Medicine

## 2016-05-27 ENCOUNTER — Ambulatory Visit (INDEPENDENT_AMBULATORY_CARE_PROVIDER_SITE_OTHER): Payer: Medicare Other | Admitting: Family Medicine

## 2016-05-27 VITALS — BP 114/76 | HR 64 | Resp 16 | Wt 195.0 lb

## 2016-05-27 DIAGNOSIS — G3184 Mild cognitive impairment, so stated: Secondary | ICD-10-CM | POA: Diagnosis not present

## 2016-05-27 DIAGNOSIS — I2510 Atherosclerotic heart disease of native coronary artery without angina pectoris: Secondary | ICD-10-CM | POA: Diagnosis not present

## 2016-05-27 DIAGNOSIS — I480 Paroxysmal atrial fibrillation: Secondary | ICD-10-CM

## 2016-05-27 MED ORDER — DONEPEZIL HCL 10 MG PO TABS
10.0000 mg | ORAL_TABLET | Freq: Every day | ORAL | 6 refills | Status: DC
Start: 2016-05-27 — End: 2016-09-03

## 2016-05-27 NOTE — Progress Notes (Signed)
Jeremy Kane  MRN: [id number] DOB: [birth year]  Subjective:  HPI  Patient is here for follow up from last office visit on 04/23/16. At that time discussed the neurology visit he had in December 2017 and started patient on Aricept. He has not noticed much of a difference, still has issues with memory off and on like he was driving and forgot how to get here or where Jeremy [address] is. Depression screen [facility] 2/9 04/23/2016 04/11/2015  Decreased Interest 0 0  Down, Depressed, Hopeless 0 0  PHQ - 2 Score 0 0   Patient has moved to [place] about 1 week ago and is trying to adjust to this. He bought a home there. Patient Active Problem List   Diagnosis Date Noted  . Coronary artery disease involving native coronary artery of native heart without angina pectoris 04/23/2016  . MCI (mild cognitive impairment) with memory loss 04/23/2016  . Alcohol abuse 06/14/2015  . Vitamin D deficiency 05/30/2015  . Hepatic steatosis 05/30/2015  . Sick sinus syndrome ([place]) 05/03/2015  . Hx of valvular heart disease 05/03/2015  . Prediabetes 05/03/2015  . Long term current use of antiarrhythmic drug 05/03/2015  . Long term current use of anticoagulant 05/03/2015  . Glaucoma 05/03/2015  . Colon polyp 08/07/2014  . Hypertension 08/07/2014  . Thrombocytopenia ([place]) 08/07/2014  . Obstructive apnea 05/03/2014  . Ventral hernia 10/04/2012  . History of major vascular surgery 08/11/2011  . Artificial cardiac pacemaker 07/17/2011  . AF (paroxysmal atrial fibrillation) ([place]) 01/02/2011    Past Medical History:  Diagnosis Date  . A-fib ([place])   . Atrial fibrillation ([place]) 10/05/2012  . Coronary artery disease   . Dysrhythmia   . Hypercholesterolemia   . Hypertension   . Sick sinus syndrome ([place])   . Sleep apnea     Social History   Social History  . Marital status: Widowed    Spouse name: N/A  . Number of children: N/A  . Years of education: N/A   Occupational History  . Not on  file.   Social History Main Topics  . Smoking status: Former Smoker    Packs/day: 1.00    Years: 19.00    Types: Cigarettes  . Smokeless tobacco: Never Used     Comment: > 35 years since quit  . Alcohol use 16.8 oz/week    28 Shots of liquor per week  . Drug use: No  . Sexual activity: Not on file   Other Topics Concern  . Not on file   Social History Narrative  . No narrative on file    Outpatient Encounter Prescriptions as of 05/27/2016  Medication Sig Note  . Cholecalciferol (VITAMIN D) 2000 units CAPS Take 1 capsule (2,000 Units total) by mouth daily.   . dabigatran (PRADAXA) 150 MG CAPS Take 150 mg by mouth 2 (two) times daily.   Marland Kitchen dofetilide (TIKOSYN) 500 MCG capsule Take 500 mcg by mouth 2 (two) times daily.   Marland Kitchen donepezil (ARICEPT) 5 MG tablet Take 1 tablet (5 mg total) by mouth at bedtime.   Marland Kitchen latanoprost (XALATAN) 0.005 % ophthalmic solution Place 1 drop into both eyes daily.   Marland Kitchen lisinopril (PRINIVIL,ZESTRIL) 10 MG tablet TAKE 1 TABLET EVERY DAY   . metoprolol tartrate (LOPRESSOR) 25 MG tablet Take 25 mg by mouth 2 (two) times daily.   . Psyllium 48.57 % POWD Take by mouth. 08/07/2014: Received from: [facility]   No facility-administered encounter medications on file as of  05/27/2016.     Allergies  Allergen Reactions  . Sulfa Antibiotics     Unsure of reaction as allergy happened as a child  . Coumadin [Warfarin Sodium] Rash    Review of Systems  Constitutional: Negative.   HENT: Negative.   Eyes: Negative.   Respiratory: Negative.   Cardiovascular: Negative.   Gastrointestinal: Negative.   Musculoskeletal: Negative.   Skin: Negative.   Neurological: Negative.   Endo/Heme/Allergies: Negative.   Psychiatric/Behavioral: Positive for memory loss.    Objective:  There were no vitals taken for this visit.  Physical Exam  Constitutional: He is well-developed, well-nourished, and in no distress.  HENT:  Head: Normocephalic and atraumatic.    Eyes: Conjunctivae are normal. No scleral icterus.  Neck: No thyromegaly present.  Cardiovascular: Normal rate, regular rhythm and normal heart sounds.   Pulmonary/Chest: Effort normal and breath sounds normal.  Skin: Skin is warm and dry.  Psychiatric: Mood, affect and judgment normal.    Assessment and Plan :  1. MCI (mild cognitive impairment) with memory loss Not better. Increase Aricept to 10 mg daily.Pt denies depresion but is very sad since the death of his wife 19 months ago. Cries easily. Not hopeless but sad.[facility]. 2.Afib 3.CAD  HPI, Exam and A&P transcribed under direction and in the presence of Miguel Aschoff, MD. I have done the exam and reviewed the chart and it is accurate to the best of my knowledge. Development worker, community has been used and  any errors in dictation or transcription are unintentional. Miguel Aschoff M.D. [facility]

## 2016-06-03 DIAGNOSIS — I4891 Unspecified atrial fibrillation: Secondary | ICD-10-CM | POA: Diagnosis not present

## 2016-06-03 DIAGNOSIS — I480 Paroxysmal atrial fibrillation: Secondary | ICD-10-CM | POA: Diagnosis not present

## 2016-06-03 DIAGNOSIS — I714 Abdominal aortic aneurysm, without rupture: Secondary | ICD-10-CM | POA: Diagnosis not present

## 2016-06-03 DIAGNOSIS — I361 Nonrheumatic tricuspid (valve) insufficiency: Secondary | ICD-10-CM | POA: Diagnosis not present

## 2016-06-03 DIAGNOSIS — I351 Nonrheumatic aortic (valve) insufficiency: Secondary | ICD-10-CM | POA: Diagnosis not present

## 2016-06-03 DIAGNOSIS — I472 Ventricular tachycardia: Secondary | ICD-10-CM | POA: Diagnosis not present

## 2016-06-03 DIAGNOSIS — G4733 Obstructive sleep apnea (adult) (pediatric): Secondary | ICD-10-CM | POA: Diagnosis not present

## 2016-06-03 DIAGNOSIS — Z9889 Other specified postprocedural states: Secondary | ICD-10-CM | POA: Diagnosis not present

## 2016-06-03 DIAGNOSIS — Z8679 Personal history of other diseases of the circulatory system: Secondary | ICD-10-CM | POA: Diagnosis not present

## 2016-06-03 DIAGNOSIS — I10 Essential (primary) hypertension: Secondary | ICD-10-CM | POA: Diagnosis not present

## 2016-06-03 DIAGNOSIS — E782 Mixed hyperlipidemia: Secondary | ICD-10-CM | POA: Diagnosis not present

## 2016-07-02 DIAGNOSIS — H401132 Primary open-angle glaucoma, bilateral, moderate stage: Secondary | ICD-10-CM | POA: Diagnosis not present

## 2016-07-16 ENCOUNTER — Ambulatory Visit (INDEPENDENT_AMBULATORY_CARE_PROVIDER_SITE_OTHER): Payer: Medicare Other | Admitting: Family Medicine

## 2016-07-16 ENCOUNTER — Encounter: Payer: Self-pay | Admitting: Family Medicine

## 2016-07-16 VITALS — BP 112/64 | HR 68 | Temp 98.3°F | Resp 16 | Wt 195.0 lb

## 2016-07-16 DIAGNOSIS — I2510 Atherosclerotic heart disease of native coronary artery without angina pectoris: Secondary | ICD-10-CM | POA: Diagnosis not present

## 2016-07-16 DIAGNOSIS — D1809 Hemangioma of other sites: Secondary | ICD-10-CM

## 2016-07-16 NOTE — Progress Notes (Signed)
Patient: Jeremy Kane Male    DOB: [birth year]   76 y.o.   MRN: [id number] Visit Date: 07/16/2016  Today's Provider: Wilhemena Durie, MD   Chief Complaint  Patient presents with  . Testicular Mass   Subjective:    HPI   Testicular Mass Pt notes a mass located on left testicle. Mass has been present for 2 weeks. Denies pain, dysuria.  Allergies  Allergen Reactions  . Sulfa Antibiotics     Unsure of reaction as allergy happened as a child  . Coumadin [Warfarin Sodium] Rash     Current Outpatient Prescriptions:  .  Cholecalciferol (VITAMIN D) 2000 units CAPS, Take 1 capsule (2,000 Units total) by mouth daily., Disp: 30 capsule, Rfl:  .  dabigatran (PRADAXA) 150 MG CAPS, Take 150 mg by mouth 2 (two) times daily., Disp: , Rfl:  .  dofetilide (TIKOSYN) 500 MCG capsule, Take 500 mcg by mouth 2 (two) times daily., Disp: , Rfl:  .  donepezil (ARICEPT) 10 MG tablet, Take 1 tablet (10 mg total) by mouth at bedtime., Disp: 30 tablet, Rfl: 6 .  latanoprost (XALATAN) 0.005 % ophthalmic solution, Place 1 drop into both eyes daily., Disp: , Rfl:  .  lisinopril (PRINIVIL,ZESTRIL) 10 MG tablet, TAKE 1 TABLET EVERY DAY, Disp: 30 tablet, Rfl: 12 .  metoprolol tartrate (LOPRESSOR) 25 MG tablet, Take 25 mg by mouth 2 (two) times daily., Disp: , Rfl:  .  Psyllium 48.57 % POWD, Take by mouth., Disp: , Rfl:   Review of Systems  Constitutional: Negative for activity change, appetite change, chills, diaphoresis, fatigue, fever and unexpected weight change.  Cardiovascular: Negative for chest pain, palpitations and leg swelling.  Genitourinary: Positive for scrotal swelling. Negative for dysuria and testicular pain.  Allergic/Immunologic: Negative.     Social History  Substance Use Topics  . Smoking status: Former Smoker    Packs/day: 1.00    Years: 19.00    Types: Cigarettes  . Smokeless tobacco: Never Used     Comment: > 35 years since quit  . Alcohol use 16.8 oz/week    28  Shots of liquor per week   Objective:   BP 112/64 (BP Location: Right Arm, Patient Position: Sitting, Cuff Size: Large)   Pulse 68   Temp 98.3 F (36.8 C) (Oral)   Resp 16   Wt 195 lb (88.5 kg) Comment: per patient  BMI 25.04 kg/m  Vitals:   07/16/16 0906  BP: 112/64  Pulse: 68  Resp: 16  Temp: 98.3 F (36.8 C)  TempSrc: Oral  Weight: 195 lb (88.5 kg)     Physical Exam  Constitutional: He is oriented to person, place, and time. He appears well-developed and well-nourished.  HENT:  Head: Normocephalic and atraumatic.  Eyes: Conjunctivae are normal.  Neck: No thyromegaly present.  Cardiovascular: Normal rate, regular rhythm and normal heart sounds.   Pulmonary/Chest: Effort normal and breath sounds normal.  Abdominal: Soft.  Genitourinary:  Genitourinary Comments: Small irritated hemangioma of  sac.Noninfected.  Neurological: He is alert and oriented to person, place, and time.  Skin: Skin is warm and dry.  Psychiatric: He has a normal mood and affect. His behavior is normal. Judgment and thought content normal.        Assessment & Plan:     Hemangioma Benign. MCI      Patient seen and examined by Miguel Aschoff, MD, and note scribed by Renaldo Fiddler, CMA. I have done the exam and  reviewed the above chart and it is accurate to the best of my knowledge. Development worker, community has been used in this note in any air is in the dictation or transcription are unintentional.  Wilhemena Durie, MD  [place]

## 2016-08-20 DIAGNOSIS — Z1283 Encounter for screening for malignant neoplasm of skin: Secondary | ICD-10-CM | POA: Diagnosis not present

## 2016-08-20 DIAGNOSIS — L821 Other seborrheic keratosis: Secondary | ICD-10-CM | POA: Diagnosis not present

## 2016-08-20 DIAGNOSIS — L812 Freckles: Secondary | ICD-10-CM | POA: Diagnosis not present

## 2016-08-20 DIAGNOSIS — D1800 Hemangioma unspecified site: Secondary | ICD-10-CM | POA: Diagnosis not present

## 2016-08-20 DIAGNOSIS — L570 Actinic keratosis: Secondary | ICD-10-CM | POA: Diagnosis not present

## 2016-08-20 DIAGNOSIS — D692 Other nonthrombocytopenic purpura: Secondary | ICD-10-CM | POA: Diagnosis not present

## 2016-08-20 DIAGNOSIS — D485 Neoplasm of uncertain behavior of skin: Secondary | ICD-10-CM | POA: Diagnosis not present

## 2016-08-20 DIAGNOSIS — L578 Other skin changes due to chronic exposure to nonionizing radiation: Secondary | ICD-10-CM | POA: Diagnosis not present

## 2016-08-20 DIAGNOSIS — Z85828 Personal history of other malignant neoplasm of skin: Secondary | ICD-10-CM | POA: Diagnosis not present

## 2016-09-03 ENCOUNTER — Telehealth: Payer: Self-pay | Admitting: Family Medicine

## 2016-09-03 ENCOUNTER — Ambulatory Visit (INDEPENDENT_AMBULATORY_CARE_PROVIDER_SITE_OTHER): Payer: Medicare Other | Admitting: Family Medicine

## 2016-09-03 ENCOUNTER — Encounter: Payer: Self-pay | Admitting: Family Medicine

## 2016-09-03 VITALS — BP 108/62 | HR 66 | Temp 97.6°F | Resp 16 | Wt 194.0 lb

## 2016-09-03 DIAGNOSIS — Z950 Presence of cardiac pacemaker: Secondary | ICD-10-CM | POA: Diagnosis not present

## 2016-09-03 DIAGNOSIS — I2510 Atherosclerotic heart disease of native coronary artery without angina pectoris: Secondary | ICD-10-CM | POA: Diagnosis not present

## 2016-09-03 DIAGNOSIS — G3184 Mild cognitive impairment, so stated: Secondary | ICD-10-CM

## 2016-09-03 DIAGNOSIS — G4733 Obstructive sleep apnea (adult) (pediatric): Secondary | ICD-10-CM | POA: Diagnosis not present

## 2016-09-03 DIAGNOSIS — I361 Nonrheumatic tricuspid (valve) insufficiency: Secondary | ICD-10-CM | POA: Diagnosis not present

## 2016-09-03 DIAGNOSIS — I480 Paroxysmal atrial fibrillation: Secondary | ICD-10-CM | POA: Diagnosis not present

## 2016-09-03 DIAGNOSIS — I714 Abdominal aortic aneurysm, without rupture: Secondary | ICD-10-CM | POA: Diagnosis not present

## 2016-09-03 DIAGNOSIS — I10 Essential (primary) hypertension: Secondary | ICD-10-CM | POA: Diagnosis not present

## 2016-09-03 DIAGNOSIS — I351 Nonrheumatic aortic (valve) insufficiency: Secondary | ICD-10-CM | POA: Diagnosis not present

## 2016-09-03 MED ORDER — DONEPEZIL HCL 5 MG PO TABS: 10.0000 mg | ORAL_TABLET | Freq: Every day | ORAL | 3 refills | Status: DC

## 2016-09-03 NOTE — Progress Notes (Signed)
Subjective:  HPI Pt is here for a 2 month follow up of MCI. Last OV we increased his Aricept to 10 mg daily. He reports that he has not noticed an increase of his "episodes" of memory loss. He says he has only had one episode this week. He has not noticed any side effects that he can tell. Pt is feeling well.   Prior to Admission medications   Medication Sig Start Date End Date Taking? Authorizing Provider  Cholecalciferol (VITAMIN D) 2000 units CAPS Take 1 capsule (2,000 Units total) by mouth daily. 05/30/15   Plonk, Gwyndolyn Saxon, MD  dabigatran (PRADAXA) 150 MG CAPS Take 150 mg by mouth 2 (two) times daily.    [provider]  dofetilide (TIKOSYN) 500 MCG capsule Take 500 mcg by mouth 2 (two) times daily.    [provider]  donepezil (ARICEPT) 10 MG tablet Take 1 tablet (10 mg total) by mouth at bedtime. 05/27/16   Jerrol Banana., MD  latanoprost (XALATAN) 0.005 % ophthalmic solution Place 1 drop into both eyes daily.    [provider]  lisinopril (PRINIVIL,ZESTRIL) 10 MG tablet TAKE 1 TABLET EVERY DAY 12/11/15   Jerrol Banana., MD  metoprolol tartrate (LOPRESSOR) 25 MG tablet Take 25 mg by mouth 2 (two) times daily.    [provider]  Psyllium 48.57 % POWD Take by mouth. 08/26/11   [provider]    Patient Active Problem List   Diagnosis Date Noted  . Coronary artery disease involving native coronary artery of native heart without angina pectoris 04/23/2016  . MCI (mild cognitive impairment) with memory loss 04/23/2016  . Vitamin D deficiency 05/30/2015  . Hepatic steatosis 05/30/2015  . Sick sinus syndrome ([place]) 05/03/2015  . Hx of valvular heart disease 05/03/2015  . Prediabetes 05/03/2015  . Long term current use of antiarrhythmic drug 05/03/2015  . Long term current use of anticoagulant 05/03/2015  . Glaucoma 05/03/2015  . Colon polyp 08/07/2014  . Hypertension 08/07/2014  . Thrombocytopenia ([place]) 08/07/2014  .  Obstructive apnea 05/03/2014  . Ventral hernia 10/04/2012  . History of major vascular surgery 08/11/2011  . Artificial cardiac pacemaker 07/17/2011  . AF (paroxysmal atrial fibrillation) ([place]) 01/02/2011    Past Medical History:  Diagnosis Date  . A-fib ([place])   . Atrial fibrillation ([place]) 10/05/2012  . Coronary artery disease   . Dysrhythmia   . Hypercholesterolemia   . Hypertension   . Sick sinus syndrome ([place])   . Sleep apnea     Social History   Social History  . Marital status: Widowed    Spouse name: N/A  . Number of children: N/A  . Years of education: N/A   Occupational History  . Not on file.   Social History Main Topics  . Smoking status: Former Smoker    Packs/day: 1.00    Years: 19.00    Types: Cigarettes  . Smokeless tobacco: Never Used     Comment: > 35 years since quit  . Alcohol use 16.8 oz/week    28 Shots of liquor per week  . Drug use: No  . Sexual activity: Not on file   Other Topics Concern  . Not on file   Social History Narrative  . No narrative on file    Allergies  Allergen Reactions  . Sulfa Antibiotics     Unsure of reaction as allergy happened as a child  . Coumadin [Warfarin Sodium] Rash    Review  of Systems  Constitutional: Negative.   HENT: Negative.   Eyes: Negative.   Respiratory: Negative.   Cardiovascular: Negative.   Gastrointestinal: Negative.   Genitourinary: Negative.   Musculoskeletal: Negative.   Skin: Negative.   Neurological: Negative.   Endo/Heme/Allergies: Negative.   Psychiatric/Behavioral: Positive for memory loss.    Immunization History  Administered Date(s) Administered  . Hepatitis A 05/27/2000, 01/12/2001  . Pneumococcal Conjugate-13 09/30/2013, 03/30/2014  . Pneumococcal Polysaccharide-23 05/04/2001, 06/11/2007  . Td 05/26/2006, 04/23/2016  . Yellow Fever 05/27/2000, 11/30/2012  . Zoster 04/13/2006, 07/20/2012    Objective:  BP 108/62 (BP Location: Left Arm, Patient Position: Sitting,  Cuff Size: Normal)   Pulse 66   Temp 97.6 F (36.4 C) (Oral)   Resp 16   Wt 194 lb (88 kg)   BMI 24.91 kg/m   Physical Exam  Lab Results  Component Value Date   WBC 5.5 12/18/2015   HGB 15.2 12/18/2015   HCT 43.0 12/18/2015   PLT 112 (L) 12/18/2015   GLUCOSE 87 12/18/2015   CHOL 168 04/12/2015   TRIG 64 04/12/2015   HDL 66 04/12/2015   LDLCALC 89 04/12/2015   TSH 1.850 12/18/2015   PSA 0.4 03/31/2014   HGBA1C 5.5 12/18/2015   MICROALBUR 50 08/21/2015    CMP     Component Value Date/Time   NA 143 12/18/2015 1602   NA 143 10/13/2012 1035   K 4.5 12/18/2015 1602   K 4.4 10/13/2012 1035   CL 99 12/18/2015 1602   CL 110 (H) 10/13/2012 1035   CO2 27 12/18/2015 1602   CO2 28 10/13/2012 1035   GLUCOSE 87 12/18/2015 1602   GLUCOSE 100 (H) 10/13/2012 1035   BUN 17 12/18/2015 1602   BUN 18 10/13/2012 1035   CREATININE 1.28 (H) 12/18/2015 1602   CREATININE 0.96 10/13/2012 1035   CALCIUM 9.6 12/18/2015 1602   CALCIUM 8.9 10/13/2012 1035   PROT 6.9 12/18/2015 1602   ALBUMIN 4.5 12/18/2015 1602   AST 52 (H) 12/18/2015 1602   ALT 44 12/18/2015 1602   ALKPHOS 83 12/18/2015 1602   BILITOT 0.8 12/18/2015 1602   GFRNONAA 54 (L) 12/18/2015 1602   GFRNONAA >60 10/13/2012 1035   GFRAA 62 12/18/2015 1602   GFRAA >60 10/13/2012 1035    Assessment and Plan :  1. MCI (mild cognitive impairment) with memory loss Pt having loose stools, possible side effect to Donepezil. PT to stop Donepezil for the rest of the month of June then if this goes away will restart at 5 mg dose. Follow up in 2 months.   - donepezil (ARICEPT) 5 MG tablet; Take 2 tablets (10 mg total) by mouth at bedtime.  Dispense: 90 tablet; Refill: 3 2.AFib 3.HTN  HPI, Exam, and A&P Transcribed under the direction and in the presence of Mylei Brackeen L. Cranford Mon, MD  Electronically Signed: Katina Dung, [place] MD [facility] 09/03/2016 2:05 PM

## 2016-09-03 NOTE — Patient Instructions (Signed)
Stop Donepezil for the rest of the month of June. If the loose stools get better, we will restart it at the 5 mg dose instead.

## 2016-09-03 NOTE — Telephone Encounter (Signed)
Error/MW °

## 2016-09-11 DIAGNOSIS — G4733 Obstructive sleep apnea (adult) (pediatric): Secondary | ICD-10-CM | POA: Diagnosis not present

## 2016-09-11 DIAGNOSIS — Z8679 Personal history of other diseases of the circulatory system: Secondary | ICD-10-CM | POA: Diagnosis not present

## 2016-09-11 DIAGNOSIS — G3184 Mild cognitive impairment, so stated: Secondary | ICD-10-CM | POA: Diagnosis not present

## 2016-09-11 DIAGNOSIS — Z9989 Dependence on other enabling machines and devices: Secondary | ICD-10-CM | POA: Diagnosis not present

## 2016-09-11 DIAGNOSIS — I480 Paroxysmal atrial fibrillation: Secondary | ICD-10-CM | POA: Diagnosis not present

## 2016-10-30 DIAGNOSIS — H401132 Primary open-angle glaucoma, bilateral, moderate stage: Secondary | ICD-10-CM | POA: Diagnosis not present

## 2016-11-04 ENCOUNTER — Ambulatory Visit (INDEPENDENT_AMBULATORY_CARE_PROVIDER_SITE_OTHER): Payer: Medicare Other | Admitting: Family Medicine

## 2016-11-04 ENCOUNTER — Encounter: Payer: Self-pay | Admitting: Family Medicine

## 2016-11-04 VITALS — BP 98/58 | HR 60 | Temp 97.7°F | Resp 16 | Wt 197.0 lb

## 2016-11-04 DIAGNOSIS — G3184 Mild cognitive impairment, so stated: Secondary | ICD-10-CM | POA: Diagnosis not present

## 2016-11-04 DIAGNOSIS — I2510 Atherosclerotic heart disease of native coronary artery without angina pectoris: Secondary | ICD-10-CM

## 2016-11-04 DIAGNOSIS — I480 Paroxysmal atrial fibrillation: Secondary | ICD-10-CM | POA: Diagnosis not present

## 2016-11-04 NOTE — Progress Notes (Signed)
Subjective:  HPI Pt is here for a 2 month follow up on memory. He is taking Donepezil 5 mg. He was taking 10 mg but had possible side effects so it was decreased back down to 5 mg and pt reports that he is doing well and has seen a decrease in his memory loss "episodes"  Prior to Admission medications   Medication Sig Start Date End Date Taking? Authorizing Provider  Cholecalciferol (VITAMIN D) 2000 units CAPS Take 1 capsule (2,000 Units total) by mouth daily. 05/30/15   Plonk, Gwyndolyn Saxon, MD  dabigatran (PRADAXA) 150 MG CAPS Take 150 mg by mouth 2 (two) times daily.    [provider]  dofetilide (TIKOSYN) 500 MCG capsule Take 500 mcg by mouth 2 (two) times daily.    [provider]  donepezil (ARICEPT) 5 MG tablet Take 2 tablets (10 mg total) by mouth at bedtime. 09/03/16   Jerrol Banana., MD  latanoprost (XALATAN) 0.005 % ophthalmic solution Place 1 drop into both eyes daily.    [provider]  lisinopril (PRINIVIL,ZESTRIL) 10 MG tablet TAKE 1 TABLET EVERY DAY 12/11/15   Jerrol Banana., MD  metoprolol tartrate (LOPRESSOR) 25 MG tablet Take 25 mg by mouth 2 (two) times daily.    [provider]  Psyllium 48.57 % POWD Take by mouth. 08/26/11   [provider]    Patient Active Problem List   Diagnosis Date Noted  . Coronary artery disease involving native coronary artery of native heart without angina pectoris 04/23/2016  . MCI (mild cognitive impairment) with memory loss 04/23/2016  . Vitamin D deficiency 05/30/2015  . Hepatic steatosis 05/30/2015  . Sick sinus syndrome ([place]) 05/03/2015  . Hx of valvular heart disease 05/03/2015  . Prediabetes 05/03/2015  . Long term current use of antiarrhythmic drug 05/03/2015  . Long term current use of anticoagulant 05/03/2015  . Glaucoma 05/03/2015  . Colon polyp 08/07/2014  . Hypertension 08/07/2014  . Thrombocytopenia ([place]) 08/07/2014  . Obstructive apnea 05/03/2014  . Ventral hernia  10/04/2012  . History of major vascular surgery 08/11/2011  . Artificial cardiac pacemaker 07/17/2011  . AF (paroxysmal atrial fibrillation) ([place]) 01/02/2011    Past Medical History:  Diagnosis Date  . A-fib ([place])   . Atrial fibrillation ([place]) 10/05/2012  . Coronary artery disease   . Dysrhythmia   . Hypercholesterolemia   . Hypertension   . Sick sinus syndrome ([place])   . Sleep apnea     Social History   Social History  . Marital status: Widowed    Spouse name: N/A  . Number of children: N/A  . Years of education: N/A   Occupational History  . Not on file.   Social History Main Topics  . Smoking status: Former Smoker    Packs/day: 1.00    Years: 19.00    Types: Cigarettes  . Smokeless tobacco: Never Used     Comment: > 35 years since quit  . Alcohol use 16.8 oz/week    28 Shots of liquor per week  . Drug use: No  . Sexual activity: Not on file   Other Topics Concern  . Not on file   Social History Narrative  . No narrative on file    Allergies  Allergen Reactions  . Sulfa Antibiotics     Unsure of reaction as allergy happened as a child  . Coumadin [Warfarin Sodium] Rash    Review of Systems  Constitutional: Negative.   HENT:  Negative.   Eyes: Negative.   Respiratory: Negative.   Cardiovascular: Negative.   Gastrointestinal: Negative.   Genitourinary: Negative.   Musculoskeletal: Negative.   Skin: Negative.   Neurological: Negative.   Endo/Heme/Allergies: Negative.   Psychiatric/Behavioral: Positive for memory loss.    Immunization History  Administered Date(s) Administered  . Hepatitis A 05/27/2000, 01/12/2001  . Pneumococcal Conjugate-13 09/30/2013, 03/30/2014  . Pneumococcal Polysaccharide-23 05/04/2001, 06/11/2007  . Td 05/26/2006, 04/23/2016  . Yellow Fever 05/27/2000, 11/30/2012  . Zoster 04/13/2006, 07/20/2012    Objective:  BP (!) 98/58 (BP Location: Left Arm, Patient Position: Sitting, Cuff Size: Normal)   Pulse 60   Temp 97.7  F (36.5 C) (Oral)   Resp 16   Wt 197 lb (89.4 kg)   BMI 25.29 kg/m   Physical Exam  Lab Results  Component Value Date   WBC 5.5 12/18/2015   HGB 15.2 12/18/2015   HCT 43.0 12/18/2015   PLT 112 (L) 12/18/2015   GLUCOSE 87 12/18/2015   CHOL 168 04/12/2015   TRIG 64 04/12/2015   HDL 66 04/12/2015   LDLCALC 89 04/12/2015   TSH 1.850 12/18/2015   PSA 0.4 03/31/2014   HGBA1C 5.5 12/18/2015   MICROALBUR 50 08/21/2015    CMP     Component Value Date/Time   NA 143 12/18/2015 1602   NA 143 10/13/2012 1035   K 4.5 12/18/2015 1602   K 4.4 10/13/2012 1035   CL 99 12/18/2015 1602   CL 110 (H) 10/13/2012 1035   CO2 27 12/18/2015 1602   CO2 28 10/13/2012 1035   GLUCOSE 87 12/18/2015 1602   GLUCOSE 100 (H) 10/13/2012 1035   BUN 17 12/18/2015 1602   BUN 18 10/13/2012 1035   CREATININE 1.28 (H) 12/18/2015 1602   CREATININE 0.96 10/13/2012 1035   CALCIUM 9.6 12/18/2015 1602   CALCIUM 8.9 10/13/2012 1035   PROT 6.9 12/18/2015 1602   ALBUMIN 4.5 12/18/2015 1602   AST 52 (H) 12/18/2015 1602   ALT 44 12/18/2015 1602   ALKPHOS 83 12/18/2015 1602   BILITOT 0.8 12/18/2015 1602   GFRNONAA 54 (L) 12/18/2015 1602   GFRNONAA >60 10/13/2012 1035   GFRAA 62 12/18/2015 1602   GFRAA >60 10/13/2012 1035    Assessment and Plan :  MCI No changes. AFib  I have done the exam and reviewed the above chart and it is accurate to the best of my knowledge. Development worker, community has been used in this note in any air is in the dictation or transcription are unintentional.  [facility] 11/04/2016 1:59 PM

## 2016-11-06 DIAGNOSIS — H401132 Primary open-angle glaucoma, bilateral, moderate stage: Secondary | ICD-10-CM | POA: Diagnosis not present

## 2016-11-25 DIAGNOSIS — I351 Nonrheumatic aortic (valve) insufficiency: Secondary | ICD-10-CM | POA: Diagnosis not present

## 2016-11-25 DIAGNOSIS — I480 Paroxysmal atrial fibrillation: Secondary | ICD-10-CM | POA: Diagnosis not present

## 2016-11-25 DIAGNOSIS — I10 Essential (primary) hypertension: Secondary | ICD-10-CM | POA: Diagnosis not present

## 2016-12-08 DIAGNOSIS — H401132 Primary open-angle glaucoma, bilateral, moderate stage: Secondary | ICD-10-CM | POA: Diagnosis not present

## 2016-12-16 DIAGNOSIS — Z23 Encounter for immunization: Secondary | ICD-10-CM | POA: Diagnosis not present

## 2016-12-18 DIAGNOSIS — Z79899 Other long term (current) drug therapy: Secondary | ICD-10-CM | POA: Diagnosis not present

## 2016-12-18 DIAGNOSIS — Z5181 Encounter for therapeutic drug level monitoring: Secondary | ICD-10-CM | POA: Diagnosis not present

## 2016-12-24 ENCOUNTER — Telehealth: Payer: Self-pay | Admitting: Family Medicine

## 2016-12-24 NOTE — Telephone Encounter (Signed)
Pt son Lovena Le is requesting to speak with Dr Rosanna Randy.  Pt son states pt is planning a trip to [place] with a friend November 2018.  Pt son has concern about this trip and wants to discuss these concerns with Dr Rosanna Randy. CB#[phone]/MW

## 2016-12-24 NOTE — Telephone Encounter (Signed)
Please review-Jeremy Kane, RMA  

## 2016-12-25 ENCOUNTER — Other Ambulatory Visit: Payer: Self-pay | Admitting: Family Medicine

## 2017-01-07 DIAGNOSIS — H401132 Primary open-angle glaucoma, bilateral, moderate stage: Secondary | ICD-10-CM | POA: Diagnosis not present

## 2017-01-09 ENCOUNTER — Telehealth: Payer: Self-pay | Admitting: Emergency Medicine

## 2017-01-09 NOTE — Telephone Encounter (Signed)
Pt daughter called about getting a letter that states pt is not able to go on a trip that was schedule to [place]  due to his worsening dementia. They need this asap due to being able to get their money back.

## 2017-02-04 DIAGNOSIS — H401132 Primary open-angle glaucoma, bilateral, moderate stage: Secondary | ICD-10-CM | POA: Diagnosis not present

## 2017-02-19 DIAGNOSIS — D485 Neoplasm of uncertain behavior of skin: Secondary | ICD-10-CM | POA: Diagnosis not present

## 2017-02-19 DIAGNOSIS — D692 Other nonthrombocytopenic purpura: Secondary | ICD-10-CM | POA: Diagnosis not present

## 2017-02-19 DIAGNOSIS — L570 Actinic keratosis: Secondary | ICD-10-CM | POA: Diagnosis not present

## 2017-02-19 DIAGNOSIS — L853 Xerosis cutis: Secondary | ICD-10-CM | POA: Diagnosis not present

## 2017-02-19 DIAGNOSIS — D1801 Hemangioma of skin and subcutaneous tissue: Secondary | ICD-10-CM | POA: Diagnosis not present

## 2017-02-19 DIAGNOSIS — D229 Melanocytic nevi, unspecified: Secondary | ICD-10-CM | POA: Diagnosis not present

## 2017-02-19 DIAGNOSIS — L578 Other skin changes due to chronic exposure to nonionizing radiation: Secondary | ICD-10-CM | POA: Diagnosis not present

## 2017-02-19 DIAGNOSIS — L821 Other seborrheic keratosis: Secondary | ICD-10-CM | POA: Diagnosis not present

## 2017-02-19 DIAGNOSIS — Z1283 Encounter for screening for malignant neoplasm of skin: Secondary | ICD-10-CM | POA: Diagnosis not present

## 2017-02-19 DIAGNOSIS — Z85828 Personal history of other malignant neoplasm of skin: Secondary | ICD-10-CM | POA: Diagnosis not present

## 2017-03-05 DIAGNOSIS — H401132 Primary open-angle glaucoma, bilateral, moderate stage: Secondary | ICD-10-CM | POA: Diagnosis not present

## 2017-03-09 DIAGNOSIS — Z9989 Dependence on other enabling machines and devices: Secondary | ICD-10-CM | POA: Diagnosis not present

## 2017-03-09 DIAGNOSIS — G3184 Mild cognitive impairment, so stated: Secondary | ICD-10-CM | POA: Diagnosis not present

## 2017-03-09 DIAGNOSIS — Z8679 Personal history of other diseases of the circulatory system: Secondary | ICD-10-CM | POA: Diagnosis not present

## 2017-03-09 DIAGNOSIS — G4733 Obstructive sleep apnea (adult) (pediatric): Secondary | ICD-10-CM | POA: Diagnosis not present

## 2017-04-01 DIAGNOSIS — I480 Paroxysmal atrial fibrillation: Secondary | ICD-10-CM | POA: Diagnosis not present

## 2017-04-01 DIAGNOSIS — I10 Essential (primary) hypertension: Secondary | ICD-10-CM | POA: Diagnosis not present

## 2017-04-01 DIAGNOSIS — I482 Chronic atrial fibrillation: Secondary | ICD-10-CM | POA: Diagnosis not present

## 2017-04-01 DIAGNOSIS — G4733 Obstructive sleep apnea (adult) (pediatric): Secondary | ICD-10-CM | POA: Diagnosis not present

## 2017-04-01 DIAGNOSIS — I351 Nonrheumatic aortic (valve) insufficiency: Secondary | ICD-10-CM | POA: Diagnosis not present

## 2017-04-12 ENCOUNTER — Other Ambulatory Visit: Payer: Self-pay | Admitting: Family Medicine

## 2017-04-12 DIAGNOSIS — G3184 Mild cognitive impairment, so stated: Secondary | ICD-10-CM

## 2017-04-12 NOTE — Progress Notes (Signed)
Worsening MCI/early dementia without behavioral issues.

## 2017-04-16 DIAGNOSIS — H401132 Primary open-angle glaucoma, bilateral, moderate stage: Secondary | ICD-10-CM | POA: Diagnosis not present

## 2017-04-17 ENCOUNTER — Encounter: Payer: Self-pay | Admitting: Family Medicine

## 2017-04-17 ENCOUNTER — Other Ambulatory Visit: Payer: Self-pay | Admitting: Family Medicine

## 2017-04-17 ENCOUNTER — Ambulatory Visit (INDEPENDENT_AMBULATORY_CARE_PROVIDER_SITE_OTHER): Payer: Medicare Other | Admitting: Family Medicine

## 2017-04-17 VITALS — BP 102/78 | HR 78 | Resp 16 | Ht 74.0 in | Wt 205.2 lb

## 2017-04-17 DIAGNOSIS — G3184 Mild cognitive impairment, so stated: Secondary | ICD-10-CM | POA: Diagnosis not present

## 2017-04-17 DIAGNOSIS — G4730 Sleep apnea, unspecified: Secondary | ICD-10-CM | POA: Diagnosis not present

## 2017-04-17 DIAGNOSIS — I10 Essential (primary) hypertension: Secondary | ICD-10-CM

## 2017-04-17 DIAGNOSIS — R2681 Unsteadiness on feet: Secondary | ICD-10-CM

## 2017-04-17 DIAGNOSIS — I495 Sick sinus syndrome: Secondary | ICD-10-CM

## 2017-04-17 DIAGNOSIS — F1010 Alcohol abuse, uncomplicated: Secondary | ICD-10-CM | POA: Diagnosis not present

## 2017-04-17 DIAGNOSIS — E559 Vitamin D deficiency, unspecified: Secondary | ICD-10-CM

## 2017-04-17 DIAGNOSIS — H409 Unspecified glaucoma: Secondary | ICD-10-CM | POA: Diagnosis not present

## 2017-04-17 DIAGNOSIS — I2510 Atherosclerotic heart disease of native coronary artery without angina pectoris: Secondary | ICD-10-CM | POA: Diagnosis not present

## 2017-04-17 DIAGNOSIS — K760 Fatty (change of) liver, not elsewhere classified: Secondary | ICD-10-CM

## 2017-04-17 DIAGNOSIS — Z1331 Encounter for screening for depression: Secondary | ICD-10-CM

## 2017-04-17 DIAGNOSIS — I480 Paroxysmal atrial fibrillation: Secondary | ICD-10-CM | POA: Diagnosis not present

## 2017-04-17 DIAGNOSIS — R7303 Prediabetes: Secondary | ICD-10-CM

## 2017-04-17 NOTE — Progress Notes (Signed)
Date:  04/17/2017   Name:  Jeremy Kane   DOB:  1940-11-07   MRN:  [id number]  PCP:  Adline Potter, MD    Chief Complaint: Establish Care   History of Present Illness:  This is a 77 y.o. male seen for initial visit. Seen in geriatric consultation 06-21-2015 at which time d/c magnesium and Tikosyn advised. Elevated LFTs prompted liver US showing steatosis and old granulomatous dz only. Today with family present admits 3-4 stiff drinks daily. Pt/family concerned about progressive memory loss over past 2 years, got lost driving to son's house recently. Wife died 3 yrs ago and moved into ALF last year, still gets tearful when wife mentioned, sister feels depressed. CAD s/p stent 20-Jun-2012, pacemake for SSS, on Pradaxa and Tikosyn for Pafib, sees Dr. Nehemiah Massed. Constipation/HLD well controlled on psyllium, HTN on metoprolol/lisinopril, glaucoma on Xalatan, saw optho last month. Started on Aricept at some point for MCI, taking vit D supplement. Known OSA but cannot tolerate CPAP, no recent titration. Admits increased fatigue over past six months and fall 2 months ago while walking dog, needed help getting up. Prediabetes last a1c 5.5% June 21, 2015. CT head 06-21-2015 showed mild parenchymal volume loss and chronic microvascular ischemic changes with progression from Jun 20, 2005. Father died prostate ca 14, mother died Alz (path positive) 40, brother died etoh/AIDS 70, sister died CJD in 52's.  Imms UTD, colonoscopy many years ago normal.  Review of Systems:  Review of Systems  Constitutional: Negative for chills and fever.  HENT: Negative for ear pain, hearing loss, sinus pain and trouble swallowing.   Eyes: Negative for pain.  Respiratory: Negative for cough and shortness of breath.   Cardiovascular: Negative for chest pain and leg swelling.  Gastrointestinal: Negative for abdominal pain, constipation and diarrhea.  Genitourinary: Negative for difficulty urinating.  Musculoskeletal: Negative for joint swelling.  Neurological:  Negative for syncope, weakness, light-headedness and numbness.  Hematological: Negative for adenopathy.    Patient Active Problem List   Diagnosis Date Noted  . Gait instability 04/17/2017  . Coronary artery disease involving native coronary artery of native heart without angina pectoris 04/23/2016  . MCI (mild cognitive impairment) with memory loss 04/23/2016  . Alcohol abuse 06/14/2015  . Vitamin D deficiency 05/30/2015  . Hepatic steatosis 05/30/2015  . Sick sinus syndrome ([place]) 05/03/2015  . Hx of valvular heart disease 05/03/2015  . Prediabetes 05/03/2015  . Long term current use of antiarrhythmic drug 05/03/2015  . Long term current use of anticoagulant 05/03/2015  . Glaucoma 05/03/2015  . Colon polyp 08/07/2014  . Hypertension 08/07/2014  . Thrombocytopenia ([place]) 08/07/2014  . Sleep apnea 05/03/2014  . Ventral hernia 10/04/2012  . History of major vascular surgery 08/11/2011  . Artificial cardiac pacemaker 07/17/2011  . AF (paroxysmal atrial fibrillation) ([place]) 01/02/2011    Prior to Admission medications   Medication Sig Start Date End Date Taking? Authorizing Provider  Cholecalciferol (VITAMIN D3) 2000 units capsule Take by mouth. 05/30/15  Yes [provider]  dabigatran (PRADAXA) 150 MG CAPS capsule TAKE 1 CAPSULE BY MOUTH TWICE DAILY 10/02/16  Yes [provider]  dofetilide (TIKOSYN) 500 MCG capsule TAKE 1 CAPSULE BY MOUTH EVERY 12 HOURS 04/02/17  Yes [provider]  donepezil (ARICEPT) 5 MG tablet Take by mouth. 09/03/16  Yes [provider]  latanoprost (XALATAN) 0.005 % ophthalmic solution Apply to eye.   Yes [provider]  lisinopril (PRINIVIL,ZESTRIL) 10 MG tablet Take by mouth.   Yes [provider]  MAGNESIUM  PO Take by mouth. 10/17/09  Yes [provider]  metoprolol tartrate (LOPRESSOR) 25 MG tablet Take 25 mg by mouth 2 (two) times daily.   Yes [provider]  PSYLLIUM PO 1 tsp in 1  glass of water 2x/day 04/11/09  Yes [provider]    Allergies  Allergen Reactions  . Sulfa Antibiotics     Unsure of reaction as allergy happened as a child  . Coumadin [Warfarin Sodium] Rash    Past Surgical History:  Procedure Laterality Date  . ANAL FISSURE REPAIR    . AORTIC VALVE REPAIR    . CARDIAC SURGERY  2013  . CATARACT EXTRACTION    . COLONOSCOPY WITH PROPOFOL N/A 07/23/2015   Procedure: COLONOSCOPY WITH PROPOFOL;  Surgeon: Lollie Sails, MD;  Location[facility] ENDOSCOPY;  Service: Endoscopy;  Laterality: N/A;  . HERNIA REPAIR  [id number]   umbilical  . OLECRANON BURSECTOMY Left 09/08/2014   Procedure: OLECRANON BURSA;  Surgeon: Christophe Louis, MD;  Location: [facility] ORS;  Service: Orthopedics;  Laterality: Left;  . PACEMAKER INSERTION  2013  . TONSILLECTOMY      Social History   Tobacco Use  . Smoking status: Former Smoker    Packs/day: 1.00    Years: 19.00    Pack years: 19.00    Types: Cigarettes  . Smokeless tobacco: Never Used  . Tobacco comment: > 35 years since quit  Substance Use Topics  . Alcohol use: Yes    Alcohol/week: 16.8 oz    Types: 28 Shots of liquor per week  . Drug use: No    Family History  Problem Relation Age of Onset  . Alzheimer's disease Mother   . Heart disease Mother   . Alcohol abuse Father   . Prostate cancer Father   . HIV/AIDS Brother     Medication list has been reviewed and updated.  Physical Examination: BP 102/78   Pulse 78   Resp 16   Ht 6\' 2"  (1.88 m)   Wt 205 lb 3.2 oz (93.1 kg)   SpO2 95%   BMI 26.35 kg/m   Physical Exam  Constitutional: He appears well-developed and well-nourished.  HENT:  Head: Normocephalic and atraumatic.  Right Ear: External ear normal.  Left Ear: External ear normal.  Nose: Nose normal.  Mouth/Throat: Oropharynx is clear and moist.  TMs clear  Eyes: Conjunctivae and EOM are normal. Pupils are equal, round, and reactive to light.  Neck: Neck supple. No thyromegaly  present.  Cardiovascular: Normal rate.  IRRR 4/6 SEM at apex  Pulmonary/Chest: Effort normal and breath sounds normal.  Abdominal: Soft. He exhibits no distension and no mass. There is no tenderness.  Musculoskeletal: He exhibits no edema.  Lymphadenopathy:    He has no cervical adenopathy.  Neurological: He is alert. Coordination normal.  SLUMS 26/30 with recall deficits (HS education)  Skin: Skin is warm and dry.  Psychiatric: He has a normal mood and affect. His behavior is normal.  GDS 2/15 PHQ9 score 16  Nursing note and vitals reviewed.   Assessment and Plan:  1. AF (paroxysmal atrial fibrillation) ([place]) In afib currently on Tikosyn/Pradaxa/Mg/BB, unclear indication for Tikosyn/Mg, followed by cards - TSH - Magnesium  2. Sick sinus syndrome [facility]) S/p pacemaker placement  3. Coronary artery disease involving native coronary artery of native heart without angina pectoris Stable on BB/ACEI/Pradaxa, no statin - Lipid Profile  4. Essential hypertension Overcontrolled today, intolerant metoprolol taper in past - Comprehensive Metabolic Panel (CMET) - CBC  5. MCI (mild cognitive impairment) with memory loss Likely mixed Alz/vascular etiology given FH and CT findings but alcohol use may be contributing, advised decreased alcohol use, no driving at night or after drinking, consider driving cessation - RPR  6. Sleep apnea, unspecified type May be contributing to cognitive sxs, needs CPAP titration - Ambulatory referral to Sleep Studies  7. Glaucoma, unspecified glaucoma type, unspecified laterality Cont Xalatan, optho following  8. Vitamin D deficiency On supplement - Vitamin D (25 hydroxy)  9. Gait instability With recent fall - B12 - Ambulatory referral to Physical Therapy  10. Alcohol abuse Advised cessation, willing to decrease to 2 drinks daily  11. Hepatic steatosis Recheck LFTs  12. Prediabetes - HgB A1c  13. Positive depression screening Consider  antidepressant next visit  Return in about 4 weeks (around 05/15/2017).   One hour spent with pt/family over half in counseling  Ronald Vinsant M. [facility]  04/17/2017

## 2017-04-18 LAB — CBC
Hematocrit: 45.4 % (ref 37.5–51.0)
Hemoglobin: 15.1 g/dL (ref 13.0–17.7)
MCH: 33.8 pg — AB (ref 26.6–33.0)
MCHC: 33.3 g/dL (ref 31.5–35.7)
MCV: 102 fL — AB (ref 79–97)
PLATELETS: 111 10*3/uL — AB (ref 150–379)
RBC: 4.47 x10E6/uL (ref 4.14–5.80)
RDW: 14 % (ref 12.3–15.4)
WBC: 5 10*3/uL (ref 3.4–10.8)

## 2017-04-18 LAB — LIPID PANEL
CHOLESTEROL TOTAL: 183 mg/dL (ref 100–199)
Chol/HDL Ratio: 2.3 ratio (ref 0.0–5.0)
HDL: 78 mg/dL (ref >39)
LDL Calculated: 93 mg/dL (ref 0–99)
Triglycerides: 59 mg/dL (ref 0–149)
VLDL Cholesterol Cal: 12 mg/dL (ref 5–40)

## 2017-04-18 LAB — COMPREHENSIVE METABOLIC PANEL
A/G RATIO: 2.1 (ref 1.2–2.2)
ALBUMIN: 4.9 g/dL — AB (ref 3.5–4.8)
ALK PHOS: 76 IU/L (ref 39–117)
ALT: 35 IU/L (ref 0–44)
AST: 44 IU/L — AB (ref 0–40)
BUN / CREAT RATIO: 16 (ref 10–24)
BUN: 17 mg/dL (ref 8–27)
Bilirubin Total: 0.7 mg/dL (ref 0.0–1.2)
CO2: 27 mmol/L (ref 20–29)
CREATININE: 1.04 mg/dL (ref 0.76–1.27)
Calcium: 9.8 mg/dL (ref 8.6–10.2)
Chloride: 100 mmol/L (ref 96–106)
GFR calc Af Amer: 79 mL/min/{1.73_m2} (ref >59)
GFR, EST NON AFRICAN AMERICAN: 68 mL/min/{1.73_m2} (ref >59)
GLOBULIN, TOTAL: 2.3 g/dL (ref 1.5–4.5)
Glucose: 186 mg/dL — ABNORMAL HIGH (ref 65–99)
POTASSIUM: 4.1 mmol/L (ref 3.5–5.2)
SODIUM: 143 mmol/L (ref 134–144)
Total Protein: 7.2 g/dL (ref 6.0–8.5)

## 2017-04-18 LAB — HEMOGLOBIN A1C
Est. average glucose Bld gHb Est-mCnc: 114 mg/dL
HEMOGLOBIN A1C: 5.6 % (ref 4.8–5.6)

## 2017-04-18 LAB — RPR: RPR Ser Ql: NONREACTIVE

## 2017-04-18 LAB — MAGNESIUM: Magnesium: 2 mg/dL (ref 1.6–2.3)

## 2017-04-18 LAB — VITAMIN B12: Vitamin B-12: 373 pg/mL (ref 232–1245)

## 2017-04-18 LAB — VITAMIN D 25 HYDROXY (VIT D DEFICIENCY, FRACTURES): VIT D 25 HYDROXY: 58.6 ng/mL (ref 30.0–100.0)

## 2017-04-18 LAB — TSH: TSH: 2.12 u[IU]/mL (ref 0.450–4.500)

## 2017-04-20 ENCOUNTER — Encounter: Payer: Self-pay | Admitting: Family Medicine

## 2017-04-21 NOTE — Telephone Encounter (Signed)
Patient message

## 2017-04-27 DIAGNOSIS — G3184 Mild cognitive impairment, so stated: Secondary | ICD-10-CM | POA: Diagnosis not present

## 2017-04-27 DIAGNOSIS — Z8679 Personal history of other diseases of the circulatory system: Secondary | ICD-10-CM | POA: Diagnosis not present

## 2017-04-27 DIAGNOSIS — G4733 Obstructive sleep apnea (adult) (pediatric): Secondary | ICD-10-CM | POA: Diagnosis not present

## 2017-04-29 ENCOUNTER — Ambulatory Visit: Payer: Self-pay

## 2017-04-30 ENCOUNTER — Other Ambulatory Visit: Payer: Self-pay

## 2017-04-30 ENCOUNTER — Ambulatory Visit: Payer: Medicare Other | Attending: Neurology

## 2017-04-30 ENCOUNTER — Ambulatory Visit: Payer: Medicare Other | Attending: Family Medicine

## 2017-04-30 DIAGNOSIS — I491 Atrial premature depolarization: Secondary | ICD-10-CM | POA: Diagnosis not present

## 2017-04-30 DIAGNOSIS — R2689 Other abnormalities of gait and mobility: Secondary | ICD-10-CM | POA: Insufficient documentation

## 2017-04-30 DIAGNOSIS — R0683 Snoring: Secondary | ICD-10-CM | POA: Diagnosis not present

## 2017-04-30 DIAGNOSIS — I4891 Unspecified atrial fibrillation: Secondary | ICD-10-CM | POA: Diagnosis not present

## 2017-04-30 DIAGNOSIS — G4733 Obstructive sleep apnea (adult) (pediatric): Secondary | ICD-10-CM | POA: Insufficient documentation

## 2017-04-30 NOTE — Therapy (Signed)
[place] MAIN [facility] SERVICES [address] [place], [place], [address] Phone: [phone]   Fax:  [phone]  Physical Therapy Evaluation  Patient Details  Name: Jeremy Kane MRN: [id number] Date of Birth: 02-12-1941 Referring Provider: Rudean Haskell, MD   Encounter Date: 04/30/2017  PT End of Session - 05/01/17 0725    Visit Number  1    Number of Visits  1    PT Start Time  1353    PT Stop Time  1435    PT Time Calculation (min)  42 min    Equipment Utilized During Treatment  Gait belt    Activity Tolerance  Patient tolerated treatment well    Behavior During Therapy  Agitated       Past Medical History:  Diagnosis Date  . A-fib ([place])   . Atrial fibrillation ([place]) 10/05/2012  . Coronary artery disease   . Dysrhythmia   . Hypercholesterolemia   . Hypertension   . Sick sinus syndrome ([place])   . Sleep apnea     Past Surgical History:  Procedure Laterality Date  . ANAL FISSURE REPAIR    . AORTIC VALVE REPAIR    . CARDIAC SURGERY  2013  . CATARACT EXTRACTION    . COLONOSCOPY WITH PROPOFOL N/A 07/23/2015   Procedure: COLONOSCOPY WITH PROPOFOL;  Surgeon: Lollie Sails, MD;  Location: [facility] ENDOSCOPY;  Service: Endoscopy;  Laterality: N/A;  . HERNIA REPAIR  [id number]   umbilical  . OLECRANON BURSECTOMY Left 09/08/2014   Procedure: OLECRANON BURSA;  Surgeon: Christophe Louis, MD;  Location: [facility] ORS;  Service: Orthopedics;  Laterality: Left;  . PACEMAKER INSERTION  2013  . TONSILLECTOMY      There were no vitals filed for this visit.   Subjective Assessment - 05/01/17 0723    Subjective  Patient is a 77 year old male who presents with gait instability. Patient not accepting of therapy and reports " I don't know why I am here, I don't need physical therapy my balance is fine"    Pertinent History  Patient is a 77 year old male who presents with gait instability. PMH include A fib, Atrial fibrillation, CAD, dysrhythmia,  hypercholesterolemia, hypertension, and Sick sinus syndrome. Patient did not understand why he was here for a referral and stated that he did not need therapy. He did not think he had any balance deficits or problems with walking.     How long can you sit comfortably?  n/a    How long can you stand comfortably?  states he is fine    How long can you walk comfortably?  states he is fine    Patient Stated Goals  states he does not want/need therapy     Currently in Pain?  No/denies states only pain he has is in his wallet from having to be here.        BALANCE: Dynamic Sitting Balance  Normal Able to sit unsupported and weight shift across midline maximally   Good Able to sit unsupported and weight shift across midline moderately x  Good-/Fair+ Able to sit unsupported and weight shift across midline minimally   Fair Minimal weight shifting ipsilateral/front, difficulty crossing midline   Fair- Reach to ipsilateral side and unable to weight shift   Poor + Able to sit unsupported with min A and reach to ipsilateral side, unable to weight shift   Poor Able to sit unsupported with mod A and reach ipsilateral/front-can't cross midline  Standing Dynamic Balance  Normal Stand independently unsupported, able to weight shift and cross midline maximally   Good Stand independently unsupported, able to weight shift and cross midline moderately   Good-/Fair+ Stand independently unsupported, able to weight shift across midline minimally x  Fair Stand independently unsupported, weight shift, and reach ipsilaterally, loss of balance when crossing midline   Poor+ Able to stand with Min A and reach ipsilaterally, unable to weight shift   Poor Able to stand with Mod A and minimally reach ipsilaterally, unable to cross midline.     Static Sitting Balance  Normal Able to maintain balance against maximal resistance   Good Able to maintain balance against moderate resistance x  Good-/Fair+ Accepts minimal  resistance   Fair Able to sit unsupported without balance loss and without UE support   Poor+ Able to maintain with Minimal assistance from individual or chair   Poor Unable to maintain balance-requires mod/max support from individual or chair     Static Standing Balance  Normal Able to maintain standing balance against maximal resistance   Good Able to maintain standing balance against moderate resistance   Good-/Fair+ Able to maintain standing balance against minimal resistance x  Fair Able to stand unsupported without UE support and without LOB for 1-2 min   Fair- Requires Min A and UE support to maintain standing without loss of balance   Poor+ Requires mod A and UE support to maintain standing without loss of balance   Poor Requires max A and UE support to maintain standing balance without loss       GAIT: Decreased weight acceptance onto LLE with decreased hip extension during preswing phase  OUTCOME MEASURES: TEST Outcome Interpretation  5 times sit<>stand 12 sec >60 yo, >15 sec indicates increased risk for falls  10 meter walk test   1.25              m/s <1.0 m/s indicates increased risk for falls; limited community ambulator  DGI 19/24 Predictive of falls      Berg Balance Assessment 49/56 <36/56 (100% risk for falls), 37-45 (80% risk for falls); 46-51 (>50% risk for falls); 52-55 (lower risk <25% of falls)        Treat: Standing marches with slow raises to increase SLS 20x Tandem stance holding onto railing 30 seconds Seated adduction squeezes of pillow 10x Seated LAQ with 5 second holds 10x.     [facility] PT Assessment - 05/01/17 0001      Assessment   Medical Diagnosis  gait instability    Referring Provider  Rudean Haskell, MD    Onset Date/Surgical Date  -- unclear, patient poor historian    Hand Dominance  Right    Prior Therapy  patient doesn't recall      Precautions   Precautions  None      Restrictions   Weight Bearing Restrictions  No      Home Environment    Living Environment  Assisted living    Home Equipment  Grab bars - tub/shower      Prior Function   Level of Independence  Independent patient poor historian      Cognition   Overall Cognitive Status  History of cognitive impairments - at baseline    Memory  Impaired      Coordination   Gross Motor Movements are Fluid and Coordinated  Yes      Posture/Postural Control   Posture/Postural Control  Postural limitations    Postural Limitations  Rounded Shoulders;Forward head;Flexed trunk      Strength   Overall Strength  Deficits    Right Hip Flexion  4/5    Right Hip Extension  4-/5    Right Hip ABduction  4/5    Right Hip ADduction  4-/5    Left Hip Flexion  4-/5    Left Hip Extension  4-/5    Left Hip ABduction  4-/5    Left Hip ADduction  4-/5    Right Knee Flexion  4/5    Right Knee Extension  4/5    Left Knee Flexion  4-/5    Left Knee Extension  4-/5    Right Ankle Dorsiflexion  4/5    Right Ankle Plantar Flexion  4/5    Left Ankle Dorsiflexion  4-/5    Left Ankle Plantar Flexion  4-/5      Transfers   Transfers  Sit to Stand;Stand to Sit    Sit to Stand  7: Independent    Five time sit to stand comments   SUE assistance    Stand to Sit  7: Independent      Ambulation/Gait   Ambulation/Gait  Yes    Ambulation/Gait Assistance  7: Independent    Assistive device  None    Gait Pattern  Step-through pattern;Decreased stride length;Trunk flexed;Poor foot clearance - left    Gait velocity  1.25 m/s    Stairs  Yes    Stairs Assistance  7: Independent SUE support    Stair Management Technique  One rail Right;Forwards;Alternating pattern    Number of Stairs  4      Standardized Balance Assessment   Standardized Balance Assessment  Berg Balance Test;Dynamic Gait Index      Berg Balance Test   Sit to Stand  Able to stand without using hands and stabilize independently    Standing Unsupported  Able to stand safely 2 minutes    Sitting with Back Unsupported but  Feet Supported on Floor or Stool  Able to sit safely and securely 2 minutes    Stand to Sit  Sits safely with minimal use of hands    Transfers  Able to transfer safely, minor use of hands    Standing Unsupported with Eyes Closed  Able to stand 10 seconds safely    Standing Ubsupported with Feet Together  Able to place feet together independently and stand 1 minute safely    From Standing, Reach Forward with Outstretched Arm  Can reach forward >12 cm safely (5")    From Standing Position, Pick up Object from Floor  Able to pick up shoe safely and easily    From Standing Position, Turn to Look Behind Over each Shoulder  Looks behind from both sides and weight shifts well    Turn 360 Degrees  Able to turn 360 degrees safely but slowly    Standing Unsupported, Alternately Place Feet on Step/Stool  Able to stand independently and complete 8 steps >20 seconds    Standing Unsupported, One Foot in Front  Able to plae foot ahead of the other independently and hold 30 seconds    Standing on One Leg  Able to lift leg independently and hold equal to or more than 3 seconds    Total Score  49      Dynamic Gait Index   Level Surface  Normal    Change in Gait Speed  Normal    Gait with Horizontal Head Turns  Normal  Gait with Vertical Head Turns  Mild Impairment    Gait and Pivot Turn  Mild Impairment    Step Over Obstacle  Mild Impairment    Step Around Obstacles  Mild Impairment    Steps  Mild Impairment    Total Score  19           Objective measurements completed on examination: See above findings.              PT Education - 05/01/17 0724    Education provided  Yes    Education Details  HEP, balance deficits, how PT could help prevent falls    Person(s) Educated  Patient    Methods  Explanation;Demonstration;Verbal cues;Handout    Comprehension  Need further instruction          PT Long Term Goals - 05/01/17 0729      PT LONG TERM GOAL #1   Title  Patient will be  independent with HEP to increase stability and decrease fall risk.     Baseline  Patient given HEP and demonstrated understanding     Time  1    Period  Days    Status  Achieved             Plan - 05/01/17 0725    Clinical Impression Statement  Patient is a 77 year old male who presents to physicla therapy for gait instability. Patient declined therapeutic services, stated he did not need therapy and did not understand why he was here. Educated on balance deficits, however patient adamant that he did not need therapy. Hep given to improve strength and balance deficits at home. BERG=49/56 placing him at risk for falls, DGI 19/24 placing him at cusp at risk for instability, 10MWT-1.25 m/s , 5x STS=12 seconds. This physical therapist believes patient would benefit from skilled PT, however patient declined services.  I will be happy to see patient in the future if agreeable to therapy.      History and Personal Factors relevant to plan of care:  This patient presents with 3, personal factors/ comorbidities  and 1-2,body elements including body structures and functions, activity limitations and or participation restrictions. Patient's condition is stable    Clinical Presentation  Stable    Clinical Presentation due to:  patient report of not falling, no changes in symptom presentation     Clinical Decision Making  Low    PT Frequency  One time visit    PT Home Exercise Plan  see sheet    Consulted and Agree with Plan of Care  Patient       Patient will benefit from skilled therapeutic intervention in order to improve the following deficits and impairments:  Abnormal gait, Decreased activity tolerance, Difficulty walking, Decreased safety awareness, Impaired perceived functional ability, Improper body mechanics, Postural dysfunction, Decreased balance  Visit Diagnosis: Other abnormalities of gait and mobility     Problem List Patient Active Problem List   Diagnosis Date Noted  . Gait  instability 04/17/2017  . Positive depression screening 04/17/2017  . Coronary artery disease involving native coronary artery of native heart without angina pectoris 04/23/2016  . MCI (mild cognitive impairment) with memory loss 04/23/2016  . Alcohol abuse 06/14/2015  . Vitamin D deficiency 05/30/2015  . Hepatic steatosis 05/30/2015  . Sick sinus syndrome ([place]) 05/03/2015  . Hx of valvular heart disease 05/03/2015  . Prediabetes 05/03/2015  . Long term current use of antiarrhythmic drug 05/03/2015  . Long term current use  of anticoagulant 05/03/2015  . Glaucoma 05/03/2015  . Colon polyp 08/07/2014  . Hypertension 08/07/2014  . Thrombocytopenia ([place]) 08/07/2014  . Sleep apnea 05/03/2014  . Ventral hernia 10/04/2012  . History of major vascular surgery 08/11/2011  . Artificial cardiac pacemaker 07/17/2011  . AF (paroxysmal atrial fibrillation) ([place]) 01/02/2011   Janna Arch, PT, DPT   Janna Arch 05/01/2017, 7:30 AM  [place] MAIN [facility] SERVICES [address] [place], [place], [address] Phone: [phone]   Fax:  [phone]  Name: Jeremy Kane MRN: [id number] Date of Birth: May 10, 1940

## 2017-05-07 ENCOUNTER — Ambulatory Visit: Payer: Medicare Other

## 2017-05-07 ENCOUNTER — Encounter: Payer: Self-pay | Admitting: Family Medicine

## 2017-05-07 ENCOUNTER — Telehealth: Payer: Self-pay

## 2017-05-07 NOTE — Telephone Encounter (Signed)
LMTCB and r/s missed AWV from 04/29/17. -MM

## 2017-05-08 DIAGNOSIS — H401132 Primary open-angle glaucoma, bilateral, moderate stage: Secondary | ICD-10-CM | POA: Diagnosis not present

## 2017-05-08 NOTE — Telephone Encounter (Signed)
Thank you :)

## 2017-05-08 NOTE — Telephone Encounter (Signed)
Scheduled for 06/08/2017 at 10am

## 2017-05-11 ENCOUNTER — Ambulatory Visit: Payer: Medicare Other

## 2017-05-13 ENCOUNTER — Ambulatory Visit: Payer: Medicare Other

## 2017-05-17 ENCOUNTER — Encounter: Payer: Self-pay | Admitting: Family Medicine

## 2017-05-18 ENCOUNTER — Ambulatory Visit (INDEPENDENT_AMBULATORY_CARE_PROVIDER_SITE_OTHER): Payer: Medicare Other | Admitting: Family Medicine

## 2017-05-18 ENCOUNTER — Other Ambulatory Visit: Payer: Self-pay | Admitting: Family Medicine

## 2017-05-18 ENCOUNTER — Encounter: Payer: Self-pay | Admitting: Family Medicine

## 2017-05-18 VITALS — BP 118/78 | HR 99 | Resp 16 | Ht 74.0 in | Wt 209.0 lb

## 2017-05-18 DIAGNOSIS — I2510 Atherosclerotic heart disease of native coronary artery without angina pectoris: Secondary | ICD-10-CM

## 2017-05-18 DIAGNOSIS — R351 Nocturia: Secondary | ICD-10-CM

## 2017-05-18 DIAGNOSIS — N401 Enlarged prostate with lower urinary tract symptoms: Secondary | ICD-10-CM | POA: Diagnosis not present

## 2017-05-18 DIAGNOSIS — G3184 Mild cognitive impairment, so stated: Secondary | ICD-10-CM

## 2017-05-18 DIAGNOSIS — G4730 Sleep apnea, unspecified: Secondary | ICD-10-CM | POA: Diagnosis not present

## 2017-05-18 DIAGNOSIS — I10 Essential (primary) hypertension: Secondary | ICD-10-CM

## 2017-05-18 DIAGNOSIS — R2681 Unsteadiness on feet: Secondary | ICD-10-CM

## 2017-05-18 DIAGNOSIS — F32A Depression, unspecified: Secondary | ICD-10-CM

## 2017-05-18 DIAGNOSIS — I480 Paroxysmal atrial fibrillation: Secondary | ICD-10-CM

## 2017-05-18 DIAGNOSIS — F1010 Alcohol abuse, uncomplicated: Secondary | ICD-10-CM | POA: Diagnosis not present

## 2017-05-18 DIAGNOSIS — F329 Major depressive disorder, single episode, unspecified: Secondary | ICD-10-CM | POA: Diagnosis not present

## 2017-05-18 DIAGNOSIS — N400 Enlarged prostate without lower urinary tract symptoms: Secondary | ICD-10-CM | POA: Insufficient documentation

## 2017-05-18 MED ORDER — VITAMIN B-1 100 MG PO TABS: 100.0000 mg | ORAL_TABLET | Freq: Every day | ORAL | Status: DC

## 2017-05-18 MED ORDER — CITALOPRAM HYDROBROMIDE 20 MG PO TABS: 20.0000 mg | ORAL_TABLET | Freq: Every day | ORAL | 2 refills | Status: DC

## 2017-05-18 MED ORDER — TAMSULOSIN HCL 0.4 MG PO CAPS: 0.4000 mg | ORAL_CAPSULE | Freq: Every day | ORAL | 2 refills | Status: DC

## 2017-05-18 NOTE — Progress Notes (Signed)
Sister concerned re: Wernicke/Korsakoff syndrome, will add OTC thiamine to regimen.

## 2017-05-18 NOTE — Patient Instructions (Addendum)
Stop Tikosyn (dofetilide) and Aricept (donepezil). Begin Flomax and citalopram. Will set up CPAP titration. Follow up with physical therapy.

## 2017-05-18 NOTE — Progress Notes (Addendum)
Date:  05/18/2017   Name:  Jeremy Kane   DOB:  [birth year]   MRN:  [id number]  PCP:  Jerrol Banana., MD    Chief Complaint: Atrial Fibrillation   History of Present Illness:  This is a 77 y.o. male seen for one month f/u from initial visit. Per sister has not cut back on alcohol use, still drinking at least 4 drinks nightly, usually more. Saw PT but declined further rx despite PT feeling it would help, willing to go back. Had sleep study showing OSA but no CPAP titration done. Depression screen strongly positive, sister feels he is depressed though pt denies. C/o nocturia x 5-6, FH prostate cancer. Blood work last visit suggestive of excess alcohol use. Sister concerned seems to breath heavily after drinks.  Review of Systems:  Review of Systems  Constitutional: Negative for chills and fever.  Respiratory: Negative for cough and shortness of breath.   Cardiovascular: Negative for chest pain and leg swelling.  Gastrointestinal: Negative for abdominal pain.  Genitourinary: Negative for dysuria.  Neurological: Negative for syncope and light-headedness.    Patient Active Problem List   Diagnosis Date Noted  . Depression 05/18/2017  . BPH (benign prostatic hyperplasia) 05/18/2017  . Gait instability 04/17/2017  . Coronary artery disease involving native coronary artery of native heart without angina pectoris 04/23/2016  . MCI (mild cognitive impairment) with memory loss 04/23/2016  . Alcohol abuse 06/14/2015  . Vitamin D deficiency 05/30/2015  . Hepatic steatosis 05/30/2015  . Sick sinus syndrome ([place]) 05/03/2015  . Hx of valvular heart disease 05/03/2015  . Prediabetes 05/03/2015  . Long term current use of antiarrhythmic drug 05/03/2015  . Long term current use of anticoagulant 05/03/2015  . Glaucoma 05/03/2015  . Colon polyp 08/07/2014  . Hypertension 08/07/2014  . Thrombocytopenia ([place]) 08/07/2014  . Sleep apnea 05/03/2014  . Ventral hernia 10/04/2012  . History  of major vascular surgery 08/11/2011  . Artificial cardiac pacemaker 07/17/2011  . AF (paroxysmal atrial fibrillation) ([place]) 01/02/2011    Prior to Admission medications   Medication Sig Start Date End Date Taking? Authorizing Provider  Cholecalciferol (VITAMIN D3) 2000 units capsule Take by mouth. 05/30/15  Yes [provider]  dabigatran (PRADAXA) 150 MG CAPS capsule TAKE 1 CAPSULE BY MOUTH TWICE DAILY 10/02/16  Yes [provider]  latanoprost (XALATAN) 0.005 % ophthalmic solution Apply to eye.   Yes [provider]  lisinopril (PRINIVIL,ZESTRIL) 10 MG tablet Take by mouth.   Yes [provider]  MAGNESIUM PO Take by mouth. 10/17/09  Yes [provider]  metoprolol tartrate (LOPRESSOR) 25 MG tablet Take 25 mg by mouth 2 (two) times daily.   Yes [provider]  PSYLLIUM PO 1 tsp in 1 glass of water 2x/day 04/11/09  Yes [provider]  citalopram (CELEXA) 20 MG tablet Take 1 tablet (20 mg total) by mouth daily. 05/18/17   Maven Varelas, Gwyndolyn Saxon, MD  tamsulosin (FLOMAX) 0.4 MG CAPS capsule Take 1 capsule (0.4 mg total) by mouth daily. 05/18/17   Adline Potter, MD    Allergies  Allergen Reactions  . Sulfa Antibiotics     Unsure of reaction as allergy happened as a child  . Coumadin [Warfarin Sodium] Rash    Past Surgical History:  Procedure Laterality Date  . ANAL FISSURE REPAIR    . AORTIC VALVE REPAIR    . CARDIAC SURGERY  2013  . CATARACT EXTRACTION    . COLONOSCOPY WITH PROPOFOL N/A 07/23/2015  Procedure: COLONOSCOPY WITH PROPOFOL;  Surgeon: Lollie Sails, MD;  Location[facility] END[facility]COPY;  Service: Endoscopy;  Laterality: N/A;  . HERNIA REPAIR  2248   umbilical  . OLECRANON BURSECTOMY Left 09/08/2014   Procedure: OLECRANON BURSA;  Surgeon: Christophe Louis, MD;  Location: [facility] ORS;  Service: Orthopedics;  Laterality: Left;  . PACEMAKER INSERTION  2013  . TONSILLECTOMY      Social History   Tobacco Use  . Smoking  status: Former Smoker    Packs/day: 1.00    Years: 19.00    Pack years: 19.00    Types: Cigarettes  . Smokeless tobacco: Never Used  . Tobacco comment: > 35 years since quit  Substance Use Topics  . Alcohol use: Yes    Alcohol/week: 16.8 oz    Types: 28 Shots of liquor per week  . Drug use: No    Family History  Problem Relation Age of Onset  . Alzheimer's disease Mother   . Heart disease Mother   . Alcohol abuse Father   . Prostate cancer Father   . HIV/AIDS Brother     Medication list has been reviewed and updated.  Physical Examination: BP 118/78   Pulse 99   Resp 16   Ht 6\' 2"  (1.88 m)   Wt 209 lb (94.8 kg)   SpO2 100%   BMI 26.83 kg/m   Physical Exam  Constitutional: He appears well-developed and well-nourished.  Cardiovascular: Normal rate and normal heart sounds.  Irregularly irregular rhythm  Pulmonary/Chest: Effort normal and breath sounds normal.  Musculoskeletal: He exhibits no edema.  Neurological: He is alert.  Skin: Skin is warm and dry.  Psychiatric: His behavior is normal.  Defensive affect  Nursing note and vitals reviewed.   Assessment and Plan:  1. Depression, unspecified depression type Begin Celexa 20 mg daily, may help decrease alcohol use  2. Benign prostatic hyperplasia with nocturia Begin Flomax 0.4 mg qhs - PSA  3. Alcohol abuse Strongly encouraged limit to 2 drinks daily if unable to stop  4. MCI (mild cognitive impairment) with memory loss Primarily due to alcohol use but may have Alz/vascular components, consider thiamine  5. Gait instability Re-establish with PT - Ambulatory referral to Physical Therapy  6. Sleep apnea, unspecified type Arrange CPAP titration, wants to try nasal pillows  7. AF (paroxysmal atrial fibrillation) (HCC) Appear chronic now, d/c Tikosyn as no clear indication and interferes with Celexa use  8. Essential hypertension Well controlled on current regimen  Return in about 4 weeks (around  06/15/2017).  [name]. [facility]  05/18/2017  Addendum: Patient is using and benefiting from CPAP nightly but tolerating current mask poorly, needs new mask or nasal pillows.

## 2017-05-18 NOTE — Telephone Encounter (Signed)
Patient mychart message.

## 2017-05-19 ENCOUNTER — Ambulatory Visit: Payer: Medicare Other

## 2017-05-19 ENCOUNTER — Other Ambulatory Visit: Payer: Self-pay | Admitting: Family Medicine

## 2017-05-19 ENCOUNTER — Encounter: Payer: Self-pay | Admitting: Family Medicine

## 2017-05-19 LAB — PSA: Prostate Specific Ag, Serum: 0.3 ng/mL (ref 0.0–4.0)

## 2017-05-19 NOTE — Telephone Encounter (Signed)
Patient response

## 2017-05-20 NOTE — Telephone Encounter (Signed)
Patient mychart message.

## 2017-05-22 ENCOUNTER — Ambulatory Visit: Payer: Medicare Other

## 2017-05-26 ENCOUNTER — Ambulatory Visit: Payer: Medicare Other

## 2017-05-28 ENCOUNTER — Ambulatory Visit: Payer: Medicare Other

## 2017-06-02 ENCOUNTER — Telehealth: Payer: Self-pay

## 2017-06-02 ENCOUNTER — Ambulatory Visit: Payer: Medicare Other

## 2017-06-02 NOTE — Telephone Encounter (Signed)
Advised Jeremy Kane that we have decided to STOP dealing with Sleep Med and referred him to specialist at [facility]. He will call me if no word in 3 days.

## 2017-06-04 ENCOUNTER — Ambulatory Visit: Payer: Medicare Other

## 2017-06-07 ENCOUNTER — Encounter: Payer: Self-pay | Admitting: Family Medicine

## 2017-06-08 ENCOUNTER — Encounter: Payer: Self-pay | Admitting: Family Medicine

## 2017-06-08 ENCOUNTER — Ambulatory Visit: Payer: Self-pay

## 2017-06-08 NOTE — Telephone Encounter (Signed)
Patient mychart message.

## 2017-06-09 ENCOUNTER — Ambulatory Visit: Payer: Medicare Other

## 2017-06-10 ENCOUNTER — Ambulatory Visit: Payer: Medicare Other

## 2017-06-11 ENCOUNTER — Ambulatory Visit: Payer: Medicare Other

## 2017-06-12 ENCOUNTER — Telehealth: Payer: Self-pay

## 2017-06-12 NOTE — Telephone Encounter (Signed)
Appt 07/21/27 Feeling Great and I advised his son also and they both have address and number of the place.

## 2017-06-15 ENCOUNTER — Encounter: Payer: Self-pay | Admitting: Family Medicine

## 2017-06-15 ENCOUNTER — Ambulatory Visit (INDEPENDENT_AMBULATORY_CARE_PROVIDER_SITE_OTHER): Payer: Medicare Other | Admitting: Family Medicine

## 2017-06-15 VITALS — BP 122/74 | HR 89 | Resp 16 | Ht 74.0 in | Wt 198.0 lb

## 2017-06-15 DIAGNOSIS — F329 Major depressive disorder, single episode, unspecified: Secondary | ICD-10-CM | POA: Diagnosis not present

## 2017-06-15 DIAGNOSIS — E559 Vitamin D deficiency, unspecified: Secondary | ICD-10-CM | POA: Diagnosis not present

## 2017-06-15 DIAGNOSIS — F32A Depression, unspecified: Secondary | ICD-10-CM

## 2017-06-15 DIAGNOSIS — N401 Enlarged prostate with lower urinary tract symptoms: Secondary | ICD-10-CM | POA: Diagnosis not present

## 2017-06-15 DIAGNOSIS — G3184 Mild cognitive impairment, so stated: Secondary | ICD-10-CM | POA: Diagnosis not present

## 2017-06-15 DIAGNOSIS — R351 Nocturia: Secondary | ICD-10-CM

## 2017-06-15 DIAGNOSIS — I10 Essential (primary) hypertension: Secondary | ICD-10-CM | POA: Diagnosis not present

## 2017-06-15 DIAGNOSIS — G4730 Sleep apnea, unspecified: Secondary | ICD-10-CM

## 2017-06-15 DIAGNOSIS — R7303 Prediabetes: Secondary | ICD-10-CM

## 2017-06-15 DIAGNOSIS — I2510 Atherosclerotic heart disease of native coronary artery without angina pectoris: Secondary | ICD-10-CM

## 2017-06-15 DIAGNOSIS — I480 Paroxysmal atrial fibrillation: Secondary | ICD-10-CM | POA: Diagnosis not present

## 2017-06-15 DIAGNOSIS — F1010 Alcohol abuse, uncomplicated: Secondary | ICD-10-CM

## 2017-06-15 MED ORDER — DABIGATRAN ETEXILATE MESYLATE 150 MG PO CAPS: 150.0000 mg | ORAL_CAPSULE | Freq: Two times a day (BID) | ORAL | 0 refills | Status: AC

## 2017-06-15 NOTE — Progress Notes (Signed)
Date:  06/15/2017   Name:  Jeremy Kane   DOB:  Nov 26, 1940   MRN:  [id number]  PCP:  Adline Potter, MD    Chief Complaint: Depression (follow up ) and Atrial Fibrillation (Ran out of Pradaxa please be sure in care everywhere that Dr. Gigi Gin did not want him to D/C this if possible. )   History of Present Illness:  This is a 77 y.o. male seen for one month f/u. Taking Celexa, has not noticed much difference, still fatigued and sleeping until afternoon, have some diarrhea. Admits to continued excess alcohol use. Flomax has not seemed to help with nocturia, wants to stop as many meds as possible. Never saw PT again. Has appt with sleep specialist in 2 months, wants to see sooner, not using CPAP. Afib stable off Tikosyn, needs refill Pradaxa.  Review of Systems:  Review of Systems  Constitutional: Negative for chills and fever.  Respiratory: Negative for cough and shortness of breath.   Cardiovascular: Negative for chest pain and leg swelling.  Genitourinary: Negative for difficulty urinating.  Neurological: Negative for syncope and light-headedness.    Patient Active Problem List   Diagnosis Date Noted  . Depression 05/18/2017  . BPH (benign prostatic hyperplasia) 05/18/2017  . Gait instability 04/17/2017  . Coronary artery disease involving native coronary artery of native heart without angina pectoris 04/23/2016  . MCI (mild cognitive impairment) with memory loss 04/23/2016  . Alcohol abuse 06/14/2015  . Vitamin D deficiency 05/30/2015  . Hepatic steatosis 05/30/2015  . Sick sinus syndrome ([place]) 05/03/2015  . Hx of valvular heart disease 05/03/2015  . Prediabetes 05/03/2015  . Long term current use of antiarrhythmic drug 05/03/2015  . Long term current use of anticoagulant 05/03/2015  . Glaucoma 05/03/2015  . Colon polyp 08/07/2014  . Hypertension 08/07/2014  . Thrombocytopenia ([place]) 08/07/2014  . Sleep apnea 05/03/2014  . Ventral hernia 10/04/2012  . History of major  vascular surgery 08/11/2011  . Artificial cardiac pacemaker 07/17/2011  . AF (paroxysmal atrial fibrillation) ([place]) 01/02/2011    Prior to Admission medications   Medication Sig Start Date End Date Taking? Authorizing Provider  Cholecalciferol (VITAMIN D3) 2000 units capsule Take by mouth. 05/30/15  Yes [provider]  citalopram (CELEXA) 20 MG tablet Take 1 tablet (20 mg total) by mouth daily. 05/18/17  Yes Kahne Helfand, Gwyndolyn Saxon, MD  dabigatran (PRADAXA) 150 MG CAPS capsule Take 1 capsule (150 mg total) by mouth 2 (two) times daily. 06/15/17  Yes Milka Windholz, Gwyndolyn Saxon, MD  latanoprost (XALATAN) 0.005 % ophthalmic solution Apply to eye.   Yes [provider]  lisinopril (PRINIVIL,ZESTRIL) 10 MG tablet Take by mouth.   Yes [provider]  metoprolol tartrate (LOPRESSOR) 25 MG tablet Take 25 mg by mouth 2 (two) times daily.   Yes [provider]    Allergies  Allergen Reactions  . Sulfa Antibiotics     Unsure of reaction as allergy happened as a child  . Coumadin [Warfarin Sodium] Rash    Past Surgical History:  Procedure Laterality Date  . ANAL FISSURE REPAIR    . AORTIC VALVE REPAIR    . CARDIAC SURGERY  2013  . CATARACT EXTRACTION    . COLONOSCOPY WITH PROPOFOL N/A 07/23/2015   Procedure: COLONOSCOPY WITH PROPOFOL;  Surgeon: Lollie Sails, MD;  Location: [facility] ENDOSCOPY;  Service: Endoscopy;  Laterality: N/A;  . HERNIA REPAIR  9833   umbilical  . OLECRANON BURSECTOMY Left 09/08/2014   Procedure: OLECRANON BURSA;  Surgeon: Harrell Gave  Tamala Julian, MD;  Location: [facility] ORS;  Service: Orthopedics;  Laterality: Left;  . PACEMAKER INSERTION  2013  . TONSILLECTOMY      Social History   Tobacco Use  . Smoking status: Former Smoker    Packs/day: 1.00    Years: 19.00    Pack years: 19.00    Types: Cigarettes  . Smokeless tobacco: Never Used  . Tobacco comment: > 35 years since quit  Substance Use Topics  . Alcohol use: Yes    Alcohol/week: 16.8 oz    Types: 28  Shots of liquor per week  . Drug use: No    Family History  Problem Relation Age of Onset  . Alzheimer's disease Mother   . Heart disease Mother   . Alcohol abuse Father   . Prostate cancer Father   . HIV/AIDS Brother     Medication list has been reviewed and updated.  Physical Examination: BP 122/74   Pulse 89   Resp 16   Ht 6\' 2"  (1.88 m)   Wt 198 lb (89.8 kg)   SpO2 95%   BMI 25.42 kg/m   Physical Exam  Constitutional: He appears well-developed and well-nourished.  Cardiovascular: Normal rate, regular rhythm and normal heart sounds.  Pulmonary/Chest: Effort normal and breath sounds normal.  Musculoskeletal: He exhibits no edema.  Neurological: He is alert.  Skin: Skin is warm and dry.  Psychiatric: His behavior is normal.  Irritable affect  Nursing note and vitals reviewed.   Assessment and Plan:  1. AF (paroxysmal atrial fibrillation) (HCC) Stable off Tikosyn, on metoprolol, refill Pradaxa x 30d, further refills per cards  2. Coronary artery disease involving native coronary artery of native heart without angina pectoris Stable on BB/ACEI/Pradaxa, lipids ok  3. Essential hypertension Well controlled on metoprolol/lisinopril  4. Alcohol abuse Strongly encouraged cessation, consider CMP/CBC next visit  5. Depression, unspecified depression type Unclear benefit Celexa x 1 month, continue current dose, consider increased dose or SNRI next visit  6. Sleep apnea, unspecified type CMA to contact sleep specialist to see if appt can be moved up  7. MCI (mild cognitive impairment) with memory loss Likely multifactorial, should improve off alcohol and with treatment of depression and OSA  8. Benign prostatic hyperplasia with nocturia Flomax ineffective, d/c  9. Prediabetes Well controlled in January  10. Vitamin D deficiency Well controlled on supplement  11. Med review May stop thiamine, psyllium, and Mg  Return in about 1 month (around  07/13/2017).  [name]. [facility]  06/16/2017

## 2017-06-17 ENCOUNTER — Other Ambulatory Visit: Payer: Self-pay | Admitting: Family Medicine

## 2017-06-17 DIAGNOSIS — I10 Essential (primary) hypertension: Secondary | ICD-10-CM | POA: Diagnosis not present

## 2017-06-17 DIAGNOSIS — I361 Nonrheumatic tricuspid (valve) insufficiency: Secondary | ICD-10-CM | POA: Diagnosis not present

## 2017-06-17 DIAGNOSIS — E782 Mixed hyperlipidemia: Secondary | ICD-10-CM | POA: Diagnosis not present

## 2017-06-17 DIAGNOSIS — I480 Paroxysmal atrial fibrillation: Secondary | ICD-10-CM | POA: Diagnosis not present

## 2017-06-17 DIAGNOSIS — I472 Ventricular tachycardia: Secondary | ICD-10-CM | POA: Diagnosis not present

## 2017-06-17 DIAGNOSIS — I351 Nonrheumatic aortic (valve) insufficiency: Secondary | ICD-10-CM | POA: Diagnosis not present

## 2017-06-18 ENCOUNTER — Ambulatory Visit: Payer: Medicare Other

## 2017-06-29 ENCOUNTER — Encounter: Payer: Self-pay | Admitting: Family Medicine

## 2017-07-08 ENCOUNTER — Ambulatory Visit: Payer: Medicare Other | Admitting: Family Medicine

## 2017-07-09 ENCOUNTER — Ambulatory Visit: Payer: Medicare Other | Admitting: Family Medicine

## 2017-07-16 ENCOUNTER — Other Ambulatory Visit: Payer: Self-pay | Admitting: Family Medicine

## 2017-07-20 ENCOUNTER — Ambulatory Visit: Payer: Medicare Other | Admitting: Family Medicine

## 2017-07-27 ENCOUNTER — Ambulatory Visit: Payer: Medicare Other | Admitting: Family Medicine

## 2017-08-05 ENCOUNTER — Emergency Department: Payer: Medicare Other

## 2017-08-05 ENCOUNTER — Emergency Department
Admission: EM | Admit: 2017-08-05 | Discharge: 2017-08-05 | Disposition: A | Discharge status: Home / Self Care | Payer: Medicare Other | Attending: Emergency Medicine | Admitting: Emergency Medicine

## 2017-08-05 ENCOUNTER — Other Ambulatory Visit: Payer: Self-pay

## 2017-08-05 ENCOUNTER — Encounter: Payer: Self-pay | Admitting: Emergency Medicine

## 2017-08-05 DIAGNOSIS — Y999 Unspecified external cause status: Secondary | ICD-10-CM | POA: Diagnosis not present

## 2017-08-05 DIAGNOSIS — S0990XA Unspecified injury of head, initial encounter: Secondary | ICD-10-CM | POA: Diagnosis present

## 2017-08-05 DIAGNOSIS — S50311A Abrasion of right elbow, initial encounter: Secondary | ICD-10-CM | POA: Diagnosis not present

## 2017-08-05 DIAGNOSIS — I2510 Atherosclerotic heart disease of native coronary artery without angina pectoris: Secondary | ICD-10-CM | POA: Diagnosis not present

## 2017-08-05 DIAGNOSIS — I480 Paroxysmal atrial fibrillation: Secondary | ICD-10-CM | POA: Insufficient documentation

## 2017-08-05 DIAGNOSIS — W19XXXA Unspecified fall, initial encounter: Secondary | ICD-10-CM

## 2017-08-05 DIAGNOSIS — F10120 Alcohol abuse with intoxication, uncomplicated: Secondary | ICD-10-CM | POA: Diagnosis not present

## 2017-08-05 DIAGNOSIS — W0110XA Fall on same level from slipping, tripping and stumbling with subsequent striking against unspecified object, initial encounter: Secondary | ICD-10-CM | POA: Diagnosis not present

## 2017-08-05 DIAGNOSIS — Y939 Activity, unspecified: Secondary | ICD-10-CM | POA: Insufficient documentation

## 2017-08-05 DIAGNOSIS — Y929 Unspecified place or not applicable: Secondary | ICD-10-CM | POA: Insufficient documentation

## 2017-08-05 DIAGNOSIS — F10920 Alcohol use, unspecified with intoxication, uncomplicated: Secondary | ICD-10-CM

## 2017-08-05 DIAGNOSIS — I119 Hypertensive heart disease without heart failure: Secondary | ICD-10-CM | POA: Insufficient documentation

## 2017-08-05 DIAGNOSIS — T148XXA Other injury of unspecified body region, initial encounter: Secondary | ICD-10-CM

## 2017-08-05 DIAGNOSIS — Z950 Presence of cardiac pacemaker: Secondary | ICD-10-CM | POA: Diagnosis not present

## 2017-08-05 DIAGNOSIS — S0003XA Contusion of scalp, initial encounter: Secondary | ICD-10-CM | POA: Diagnosis not present

## 2017-08-05 LAB — CBC WITH DIFFERENTIAL/PLATELET
BASOS PCT: 1 %
Basophils Absolute: 0 10*3/uL (ref 0–0.1)
Eosinophils Absolute: 0.1 10*3/uL (ref 0–0.7)
Eosinophils Relative: 1 %
HEMATOCRIT: 42.4 % (ref 40.0–52.0)
HEMOGLOBIN: 14.4 g/dL (ref 13.0–18.0)
LYMPHS PCT: 20 %
Lymphs Abs: 1.2 10*3/uL (ref 1.0–3.6)
MCH: 34.8 pg — ABNORMAL HIGH (ref 26.0–34.0)
MCHC: 33.9 g/dL (ref 32.0–36.0)
MCV: 102.9 fL — ABNORMAL HIGH (ref 80.0–100.0)
MONO ABS: 0.7 10*3/uL (ref 0.2–1.0)
MONOS PCT: 12 %
NEUTROS ABS: 3.9 10*3/uL (ref 1.4–6.5)
NEUTROS PCT: 66 %
Platelets: 102 10*3/uL — ABNORMAL LOW (ref 150–440)
RBC: 4.12 MIL/uL — ABNORMAL LOW (ref 4.40–5.90)
RDW: 14 % (ref 11.5–14.5)
WBC: 5.9 10*3/uL (ref 3.8–10.6)

## 2017-08-05 LAB — URINALYSIS, COMPLETE (UACMP) WITH MICROSCOPIC
Bacteria, UA: NONE SEEN
Bilirubin Urine: NEGATIVE
GLUCOSE, UA: NEGATIVE mg/dL
Hgb urine dipstick: NEGATIVE
Ketones, ur: NEGATIVE mg/dL
Leukocytes, UA: NEGATIVE
Nitrite: NEGATIVE
PH: 5 (ref 5.0–8.0)
Protein, ur: NEGATIVE mg/dL
SPECIFIC GRAVITY, URINE: 1.011 (ref 1.005–1.030)
SQUAMOUS EPITHELIAL / LPF: NONE SEEN (ref 0–5)

## 2017-08-05 LAB — COMPREHENSIVE METABOLIC PANEL
ALBUMIN: 4.2 g/dL (ref 3.5–5.0)
ALK PHOS: 64 U/L (ref 38–126)
ALT: 23 U/L (ref 17–63)
AST: 35 U/L (ref 15–41)
Anion gap: 8 (ref 5–15)
BILIRUBIN TOTAL: 1 mg/dL (ref 0.3–1.2)
BUN: 16 mg/dL (ref 6–20)
CALCIUM: 8.5 mg/dL — AB (ref 8.9–10.3)
CO2: 25 mmol/L (ref 22–32)
Chloride: 104 mmol/L (ref 101–111)
Creatinine, Ser: 0.79 mg/dL (ref 0.61–1.24)
GFR calc Af Amer: >60 mL/min (ref >60)
GLUCOSE: 100 mg/dL — AB (ref 65–99)
POTASSIUM: 4.1 mmol/L (ref 3.5–5.1)
Sodium: 137 mmol/L (ref 135–145)
TOTAL PROTEIN: 7.2 g/dL (ref 6.5–8.1)

## 2017-08-05 LAB — TROPONIN I

## 2017-08-05 LAB — ETHANOL: Alcohol, Ethyl (B): 135 mg/dL — ABNORMAL HIGH (ref <10)

## 2017-08-05 NOTE — ED Notes (Signed)
Son told this RN after pt left the triage area that pt drinks heavily and is unaware when he falls or what he hits on the way down.

## 2017-08-05 NOTE — ED Notes (Signed)
Pt alert and oriented X4, active, cooperative, pt in NAD. RR even and unlabored, color WNL.  Pt informed to return if any life threatening symptoms occur.  Discharge and followup instructions reviewed. Left with son.

## 2017-08-05 NOTE — ED Notes (Signed)
Patient transported to CT 

## 2017-08-05 NOTE — ED Provider Notes (Signed)
[facility] Emergency Department Provider Note       Time seen: ----------------------------------------- 7:50 AM on 08/05/2017 -----------------------------------------   I have reviewed the triage vital signs and the nursing notes.  HISTORY   Chief Complaint Fall    HPI Jeremy Kane is a 77 y.o. male with a history of atrial fibrillation, coronary artery disease, hyperlipidemia, hypertension, sick sinus syndrome who presents to the ED for a fall last night.  Patient does not remember how or why, thinks he may have tripped and fallen.  He presents with right elbow bruising and abrasions, also large left frontal scalp hematoma.  Reportedly he takes Eliquis for A. fib.  He denies any recent illness injury, reports he is eating and drinking well.  Past Medical History:  Diagnosis Date  . A-fib ([place])   . Atrial fibrillation ([place]) 10/05/2012  . Coronary artery disease   . Dysrhythmia   . Hypercholesterolemia   . Hypertension   . Sick sinus syndrome ([place])   . Sleep apnea     Patient Active Problem List   Diagnosis Date Noted  . Depression 05/18/2017  . BPH (benign prostatic hyperplasia) 05/18/2017  . Gait instability 04/17/2017  . Coronary artery disease involving native coronary artery of native heart without angina pectoris 04/23/2016  . MCI (mild cognitive impairment) with memory loss 04/23/2016  . Alcohol abuse 06/14/2015  . Vitamin D deficiency 05/30/2015  . Hepatic steatosis 05/30/2015  . Sick sinus syndrome ([place]) 05/03/2015  . Hx of valvular heart disease 05/03/2015  . Prediabetes 05/03/2015  . Long term current use of antiarrhythmic drug 05/03/2015  . Long term current use of anticoagulant 05/03/2015  . Glaucoma 05/03/2015  . Colon polyp 08/07/2014  . Hypertension 08/07/2014  . Thrombocytopenia ([place]) 08/07/2014  . Sleep apnea 05/03/2014  . Ventral hernia 10/04/2012  . History of major vascular surgery 08/11/2011  . Artificial  cardiac pacemaker 07/17/2011  . AF (paroxysmal atrial fibrillation) ([place]) 01/02/2011    Past Surgical History:  Procedure Laterality Date  . ANAL FISSURE REPAIR    . AORTIC VALVE REPAIR    . CARDIAC SURGERY  2013  . CATARACT EXTRACTION    . COLONOSCOPY WITH PROPOFOL N/A 07/23/2015   Procedure: COLONOSCOPY WITH PROPOFOL;  Surgeon: Lollie Sails, MD;  Location: [facility] ENDOSCOPY;  Service: Endoscopy;  Laterality: N/A;  . HERNIA REPAIR  0102   umbilical  . OLECRANON BURSECTOMY Left 09/08/2014   Procedure: OLECRANON BURSA;  Surgeon: Christophe Louis, MD;  Location: [facility] ORS;  Service: Orthopedics;  Laterality: Left;  . PACEMAKER INSERTION  2013  . TONSILLECTOMY      Allergies Sulfa antibiotics and Coumadin [warfarin sodium]  Social History Social History   Tobacco Use  . Smoking status: Former Smoker    Packs/day: 1.00    Years: 19.00    Pack years: 19.00    Types: Cigarettes  . Smokeless tobacco: Never Used  . Tobacco comment: > 35 years since quit  Substance Use Topics  . Alcohol use: Yes    Alcohol/week: 16.8 oz    Types: 28 Shots of liquor per week  . Drug use: No   Review of Systems Constitutional: Negative for fever. Cardiovascular: Negative for chest pain. Respiratory: Negative for shortness of breath. Gastrointestinal: Negative for abdominal pain, vomiting and diarrhea. Musculoskeletal: Negative for back pain. Skin: Positive for abrasions and contusions Neurological: Positive for headache  All systems negative/normal/unremarkable except as stated in the HPI  ____________________________________________   PHYSICAL EXAM:  VITAL  SIGNS: ED Triage Vitals  Enc Vitals Group     BP 08/05/17 0741 (!) 128/93     Pulse Rate 08/05/17 0741 70     Resp --      Temp 08/05/17 0741 98.1 F (36.7 C)     Temp Source 08/05/17 0741 Oral     SpO2 08/05/17 0741 97 %     Weight 08/05/17 0742 208 lb (94.3 kg)     Height 08/05/17 0742 6\' 2"  (1.88 m)     Head Circumference  --      Peak Flow --      Pain Score 08/05/17 0742 2     Pain Loc --      Pain Edu? --      Excl. in [place]? --     Constitutional: Alert and oriented. Well appearing and in no distress. Eyes: Conjunctivae are normal. Normal extraocular movements. ENT   Head: Normocephalic, large left frontal scalp hematoma and ecchymosis   Nose: No congestion/rhinnorhea.   Mouth/Throat: Mucous membranes are moist.   Neck: No stridor. Cardiovascular: Irregularly irregular rhythm. No murmurs, rubs, or gallops. Respiratory: Normal respiratory effort without tachypnea nor retractions. Breath sounds are clear and equal bilaterally. No wheezes/rales/rhonchi. Gastrointestinal: Soft and nontender. Normal bowel sounds Musculoskeletal: Nontender with normal range of motion in extremities. No lower extremity tenderness nor edema. Neurologic:  Normal speech and language. No gross focal neurologic deficits are appreciated.  Skin: Frontal scalp hematoma, right elbow contusions and abrasions are noted Psychiatric: Mood and affect are normal. Speech and behavior are normal.  ____________________________________________  EKG: Interpreted by me.  Atrial fibrillation with a rate of 66 bpm, normal QRS, normal QT, normal axis  ____________________________________________  ED COURSE:  As part of my medical decision making, I reviewed the following data within the [place] History obtained from family if available, nursing notes, old chart and ekg, as well as notes from prior ED visits. Patient presented for fall, we will assess with labs and imaging as indicated at this time. Clinical Course as of Aug 06 902  Wed Aug 05, 2017  0904 Alcohol, Ethyl (B)(!): 135 [JW]    Clinical Course User Index [JW] Earleen Newport, MD   Procedures ____________________________________________   LABS (pertinent positives/negatives)  Labs Reviewed  CBC WITH DIFFERENTIAL/PLATELET - Abnormal;  Notable for the following components:      Result Value   RBC 4.12 (*)    MCV 102.9 (*)    MCH 34.8 (*)    Platelets 102 (*)    All other components within normal limits  COMPREHENSIVE METABOLIC PANEL - Abnormal; Notable for the following components:   Glucose, Bld 100 (*)    All other components within normal limits  URINALYSIS, COMPLETE (UACMP) WITH MICROSCOPIC - Abnormal; Notable for the following components:   Color, Urine YELLOW (*)    APPearance CLEAR (*)    All other components within normal limits  ETHANOL - Abnormal; Notable for the following components:   Alcohol, Ethyl (B) 135 (*)    All other components within normal limits  TROPONIN I    RADIOLOGY Images were viewed by me  CT head, right elbow x-rays IMPRESSION: 1. Large left frontal scalp hematoma. No underlying displaced skull fracture or findings to suggest significant acute traumatic injury to the brain. 2. Moderate cerebral atrophy with extensive chronic microvascular ischemic changes in the cerebral white matter, as above. IMPRESSION: No acute osseous abnormalities.  ____________________________________________  DIFFERENTIAL DIAGNOSIS   Contusion,  abrasion, subdural hematoma, skull fracture, elbow fracture, dehydration, electrolyte abnormality, occult infection  FINAL ASSESSMENT AND PLAN  Fall, head injury, contusion, alcohol intoxication   Plan: The patient had presented for unwitnessed fall with head injury. Patient's labs did reveal significant alcohol intoxication. Patient's imaging is unremarkable for serious injury.  Will advise an ice pack, topical wound care and follow-up as needed.   Laurence Aly, MD   Note: This note was generated in part or whole with voice recognition software. Voice recognition is usually quite accurate but there are transcription errors that can and very often do occur. I apologize for any typographical errors that were not detected and corrected.      Earleen Newport, MD 08/05/17 [phone]

## 2017-08-05 NOTE — ED Triage Notes (Signed)
Pt states he fell last night, does not remember how or why, presents with right elbow bruising, swelling and abrasion, knot to left forehead, pt is on blood thinner.

## 2017-08-05 NOTE — ED Notes (Signed)
First Nurse Note:  Patient states he fell last PM.  Denies LOC.  Has raised abraded area left side of forehead and right elbow.  Placed in [place].

## 2017-08-05 NOTE — ED Notes (Signed)
ED Provider at bedside. 

## 2017-08-06 ENCOUNTER — Encounter: Payer: Self-pay | Admitting: Family Medicine

## 2017-08-06 ENCOUNTER — Ambulatory Visit
Admission: RE | Admit: 2017-08-06 | Discharge: 2017-08-06 | Disposition: A | Discharge status: Home / Self Care | Payer: Medicare Other | Source: Ambulatory Visit | Attending: Family Medicine | Admitting: Family Medicine

## 2017-08-06 ENCOUNTER — Ambulatory Visit (INDEPENDENT_AMBULATORY_CARE_PROVIDER_SITE_OTHER): Payer: Medicare Other | Admitting: Family Medicine

## 2017-08-06 VITALS — BP 114/72 | HR 62 | Temp 98.7°F | Resp 16 | Wt 203.0 lb

## 2017-08-06 DIAGNOSIS — R2681 Unsteadiness on feet: Secondary | ICD-10-CM | POA: Diagnosis not present

## 2017-08-06 DIAGNOSIS — G3184 Mild cognitive impairment, so stated: Secondary | ICD-10-CM | POA: Diagnosis not present

## 2017-08-06 DIAGNOSIS — M1611 Unilateral primary osteoarthritis, right hip: Secondary | ICD-10-CM | POA: Insufficient documentation

## 2017-08-06 DIAGNOSIS — F329 Major depressive disorder, single episode, unspecified: Secondary | ICD-10-CM | POA: Diagnosis not present

## 2017-08-06 DIAGNOSIS — W19XXXS Unspecified fall, sequela: Secondary | ICD-10-CM

## 2017-08-06 DIAGNOSIS — I480 Paroxysmal atrial fibrillation: Secondary | ICD-10-CM | POA: Diagnosis not present

## 2017-08-06 DIAGNOSIS — F32A Depression, unspecified: Secondary | ICD-10-CM

## 2017-08-06 DIAGNOSIS — M25551 Pain in right hip: Secondary | ICD-10-CM | POA: Insufficient documentation

## 2017-08-06 DIAGNOSIS — I2510 Atherosclerotic heart disease of native coronary artery without angina pectoris: Secondary | ICD-10-CM | POA: Diagnosis not present

## 2017-08-06 NOTE — Progress Notes (Signed)
Patient: Jeremy Kane Male    DOB: 05-Nov-1940   77 y.o.   MRN: [id number] Visit Date: 08/06/2017  Today's Provider: Wilhemena Durie, MD   Chief Complaint  Patient presents with  . Fall  . Alcohol Intoxication   Subjective:    HPI Pt is here today after a fall. He went to the ED on 08/04/17 for fall, head injury, abrasion, alcohol intoxication. Pt has a badly bruised left eye and bruising on his head. Xray of elbow was normal and head CT showed left frontal scalp hematoma and ischemic changes in cerebral white matter. Pt is here today to get x-rays of his right hip. He reports that his hip started hurting later that night and did not get the x rays in the ED. He reports that it hurts cross his legs. He has not noticed any bruising on his hip or no trouble laying on his hip. He has mild pain with standing but none with weight bearing when he is up.    Allergies  Allergen Reactions  . Sulfa Antibiotics     Unsure of reaction as allergy happened as a child  . Coumadin [Warfarin Sodium] Rash     Current Outpatient Medications:  .  Cholecalciferol (VITAMIN D3) 2000 units capsule, Take 2,000 Units by mouth daily. , Disp: , Rfl:  .  citalopram (CELEXA) 20 MG tablet, Take 1 tablet (20 mg total) by mouth daily., Disp: 30 tablet, Rfl: 0 .  dabigatran (PRADAXA) 150 MG CAPS capsule, Take 1 capsule (150 mg total) by mouth 2 (two) times daily., Disp: 60 capsule, Rfl: 0 .  dofetilide (TIKOSYN) 500 MCG capsule, Take 500 mcg by mouth every 12 (twelve) hours., Disp: , Rfl:  .  donepezil (ARICEPT) 5 MG tablet, Take 5 mg by mouth at bedtime., Disp: , Rfl:  .  latanoprost (XALATAN) 0.005 % ophthalmic solution, Place 1 drop into both eyes at bedtime. , Disp: , Rfl:  .  lisinopril (PRINIVIL,ZESTRIL) 10 MG tablet, Take 10 mg by mouth daily. , Disp: , Rfl:  .  metoprolol succinate (TOPROL-XL) 25 MG 24 hr tablet, Take 25 mg by mouth daily., Disp: , Rfl:   Review of Systems  Constitutional:  Negative.   HENT: Negative.   Eyes: Negative.   Respiratory: Negative.   Cardiovascular: Negative.   Gastrointestinal: Negative.   Endocrine: Negative.   Musculoskeletal: Positive for arthralgias.  Skin: Negative.   Hematological: Bruises/bleeds easily.    Social History   Tobacco Use  . Smoking status: Former Smoker    Packs/day: 1.00    Years: 19.00    Pack years: 19.00    Types: Cigarettes  . Smokeless tobacco: Never Used  . Tobacco comment: > 35 years since quit  Substance Use Topics  . Alcohol use: Yes    Alcohol/week: 16.8 oz    Types: 28 Shots of liquor per week   Objective:   BP 114/72 (BP Location: Left Arm, Patient Position: Sitting, Cuff Size: Normal)   Pulse 62   Temp 98.7 F (37.1 C) (Oral)   Resp 16   Wt 203 lb (92.1 kg)   BMI 26.06 kg/m  Vitals:   08/06/17 1203  BP: 114/72  Pulse: 62  Resp: 16  Temp: 98.7 F (37.1 C)  TempSrc: Oral  Weight: 203 lb (92.1 kg)     Physical Exam  Constitutional: He is oriented to person, place, and time. He appears well-developed and well-nourished.  HENT:  Significant  recent trauma to area of left forehead and around right eye.  Eyes: Conjunctivae are normal. No scleral icterus.  Neck: No thyromegaly present.  Cardiovascular: Normal rate, regular rhythm and normal heart sounds.  Pulmonary/Chest: Effort normal and breath sounds normal.  Abdominal: Soft.  Musculoskeletal: He exhibits no edema.  Mild tenderness over right greater trochanter. No pain with rotation of hip.  Neurological: He is alert and oriented to person, place, and time.  Skin: Skin is warm and dry.  Psychiatric: He has a normal mood and affect. His behavior is normal. Judgment and thought content normal.        Assessment & Plan:     1. Fall, sequela Pt had high blood alcohol in ED--contributed to fall certainly. - DG HIP UNILAT WITH PELVIS 2-3 VIEWS RIGHT; Future  2. Right hip pain  - DG HIP UNILAT WITH PELVIS 2-3 VIEWS RIGHT;  Future 3.Early Dementia Agree with Dr Vicente Masson ,treat possible depression,cut down or eliminate alcohol. Son has pt car keys. 4.Recent Head Trauma from fall      I have done the exam and reviewed the above chart and it is accurate to the best of my knowledge. Development worker, community has been used in this note in any air is in the dictation or transcription are unintentional.  Wilhemena Durie, MD  [place]

## 2017-08-08 ENCOUNTER — Ambulatory Visit: Payer: Medicare Other | Admitting: Family Medicine

## 2017-08-18 ENCOUNTER — Ambulatory Visit: Payer: Medicare Other | Admitting: Family Medicine

## 2017-08-22 DEATH — deceased

## 2019-01-29 IMAGING — CT CT HEAD W/O CM
4 series · 16 of 47 positions shown, 18 images · non-contrast
Comparison: Head CT 01/29/2016.

CLINICAL DATA: 76-year-old male with history of trauma from a fall
yesterday evening with swelling in the left forehead.

EXAM:
CT HEAD WITHOUT CONTRAST
TECHNIQUE: Contiguous axial images were obtained from the base of the skull
through the vertex without intravenous contrast.

[Series 2: head bone · axial · 0.43mm/px · z∈[-52,-20]mm · 3 of 78 slices shown]
[im 8/78  bone]
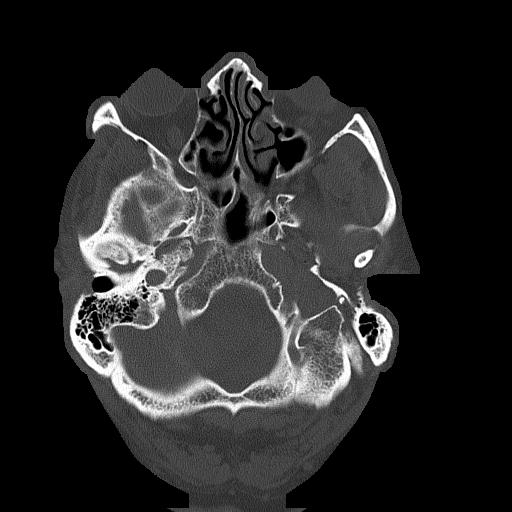
[im 16/78  bone]
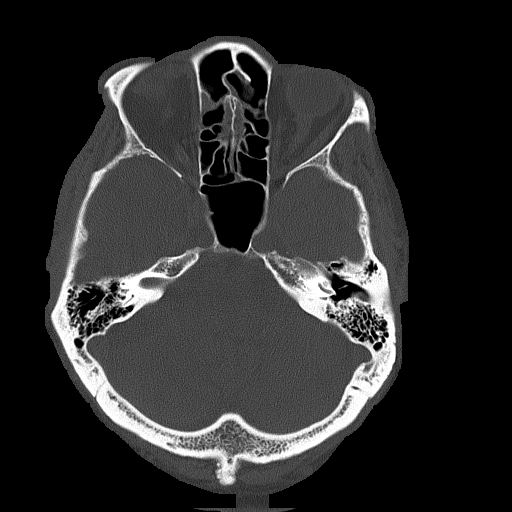
[im 24/78  bone]
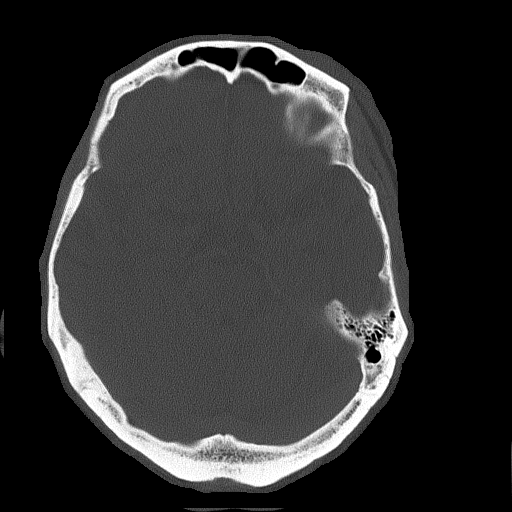

[Series 3: head wo · axial · 0.43mm/px · z∈[-51,+64]mm · 7 of 31 slices shown, 9 images]
[im 4/31  brain]
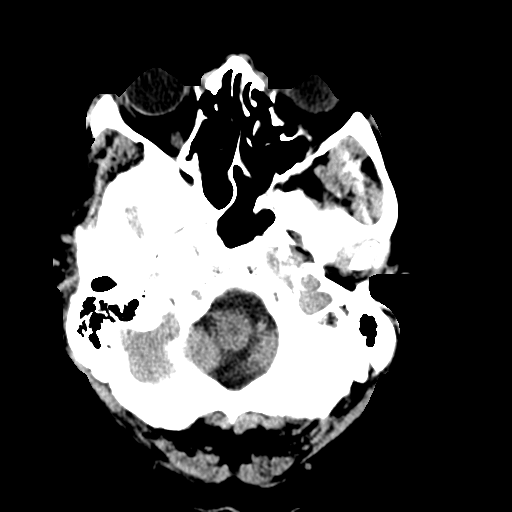
[im 4/31  bone]
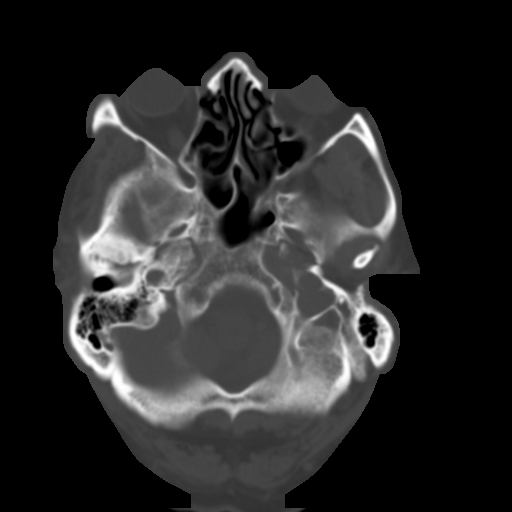
[im 8/31  brain]
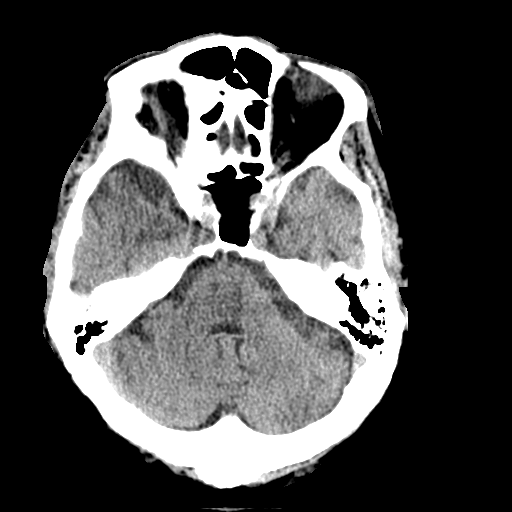
[im 12/31  brain]
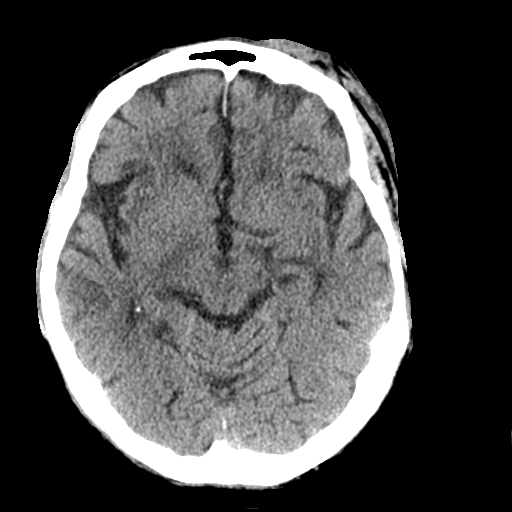
[im 16/31  brain]
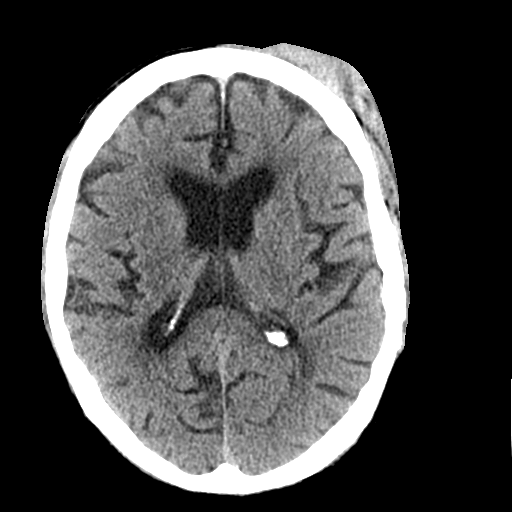
[im 19/31  brain]
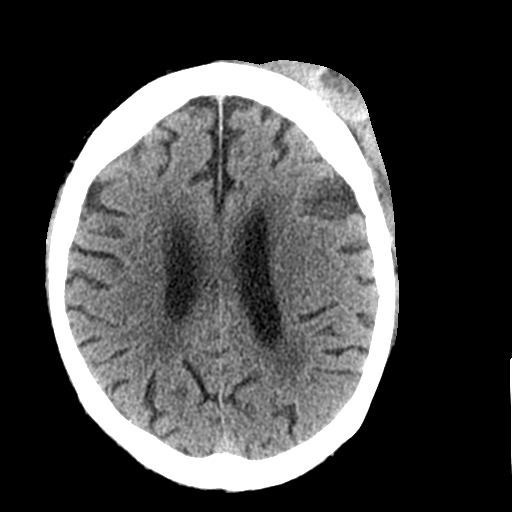
[im 19/31  bone]
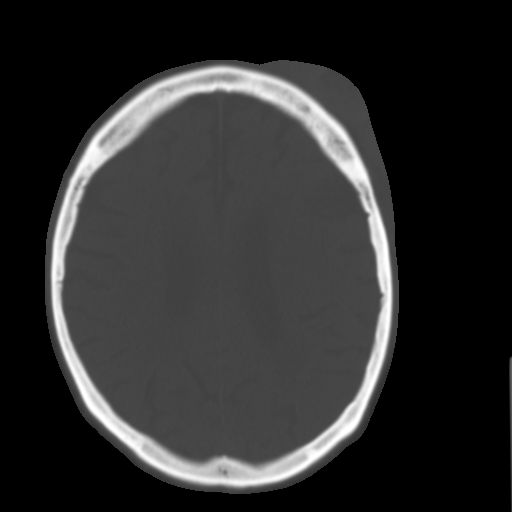
[im 23/31  brain]
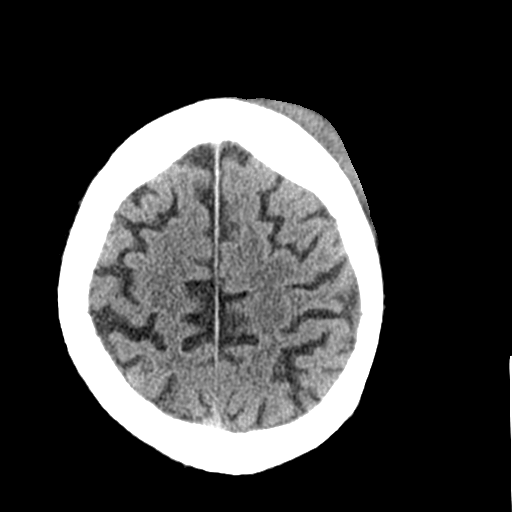
[im 27/31  brain]
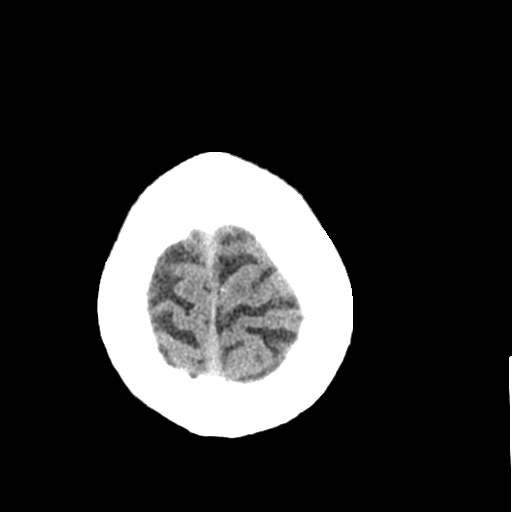

[Series 4: coronal soft tissue · coronal · 0.36mm/px · 3 of 72 slices shown]
[im 24/72  brain]
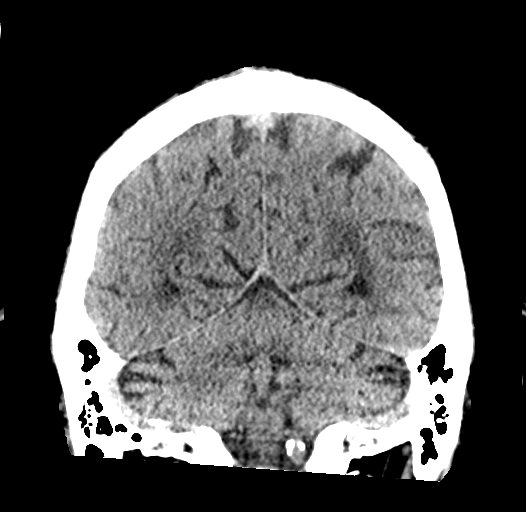
[im 32/72  brain]
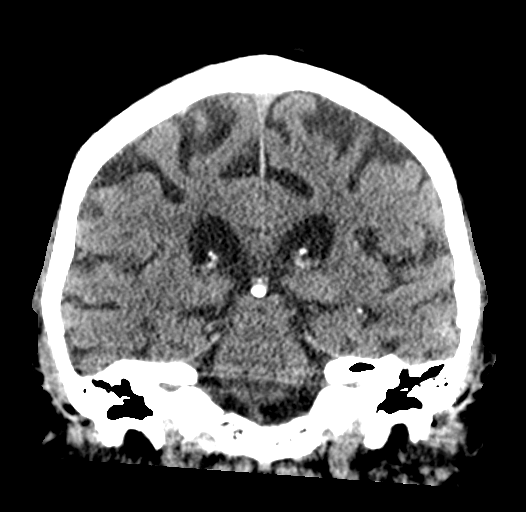
[im 40/72  brain]
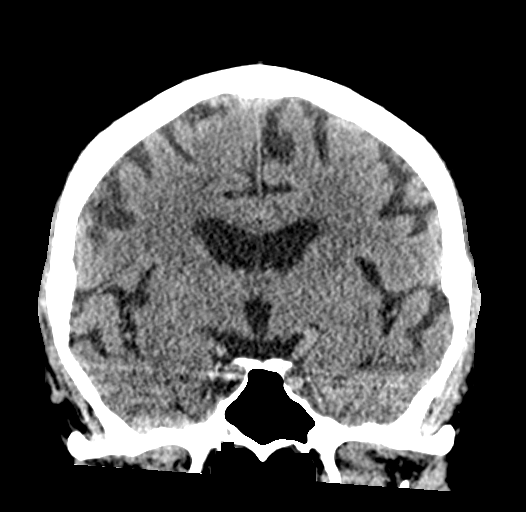

[Series 5: sagittal soft tissue · sagittal · 0.32mm/px · 3 of 64 slices shown]
[im 22/64  brain]
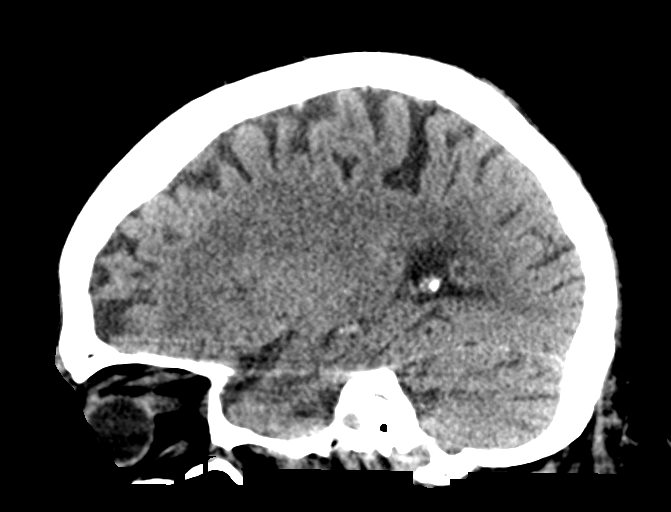
[im 32/64  brain]
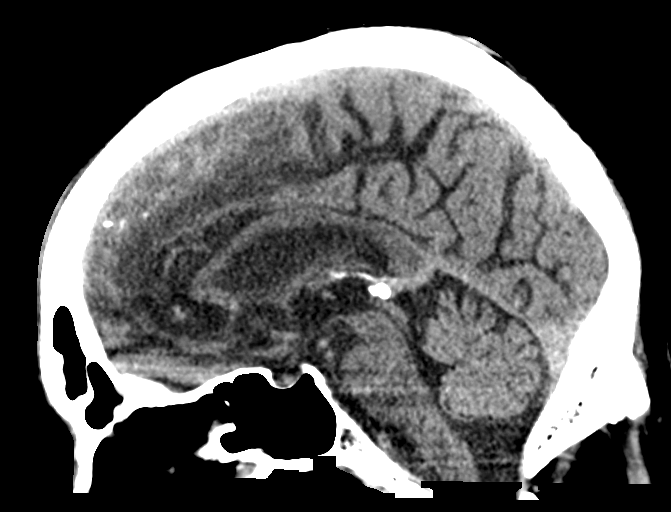
[im 43/64  brain]
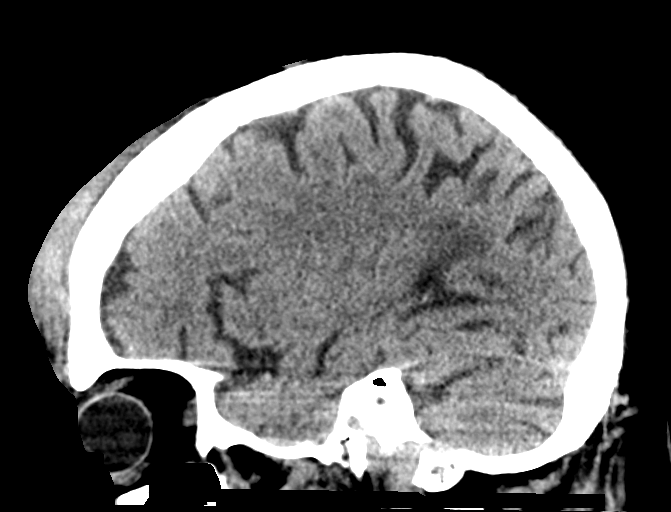

[16 of 47 positions shown; findings below may reference images not displayed]

FINDINGS: Brain: Moderate cerebral atrophy. Patchy and confluent areas of
decreased attenuation are noted throughout the deep and
periventricular white matter of the cerebral hemispheres
bilaterally, compatible with chronic microvascular ischemic disease.
No evidence of acute infarction, hemorrhage, hydrocephalus,
extra-axial collection or mass lesion/mass effect.

Vascular: No hyperdense vessel or unexpected calcification.

Skull: Normal. Negative for fracture or focal lesion.

Sinuses/Orbits: No acute finding.

Other: Large amount of soft tissue swelling throughout the left
frontal scalp, in the center of which is a more well-defined area of
high attenuation measuring 2.2 x 3.0 x 3.4 cm (axial image 18 of
series 3, and coronal image 9 of series 4), compatible with a large
scalp hematoma.
IMPRESSION: 1. Large left frontal scalp hematoma. No underlying displaced skull
fracture or findings to suggest significant acute traumatic injury
to the brain.
2. Moderate cerebral atrophy with extensive chronic microvascular
ischemic changes in the cerebral white matter, as above.

## 2019-01-30 IMAGING — CR DG HIP (WITH OR WITHOUT PELVIS) 2-3V*R*
1 series · 3 of 3 positions shown · non-contrast
Comparison: None.

CLINICAL DATA: Fell 2 days ago pain in the right hip

EXAM:
DG HIP (WITH OR WITHOUT PELVIS) 2-3V RIGHT

[Series 1: dg hip unilat w or w/o pelvis 2-3 views  · non-contrast · 0.14mm/px · 3 of 3 slices shown]
[im 1/3]
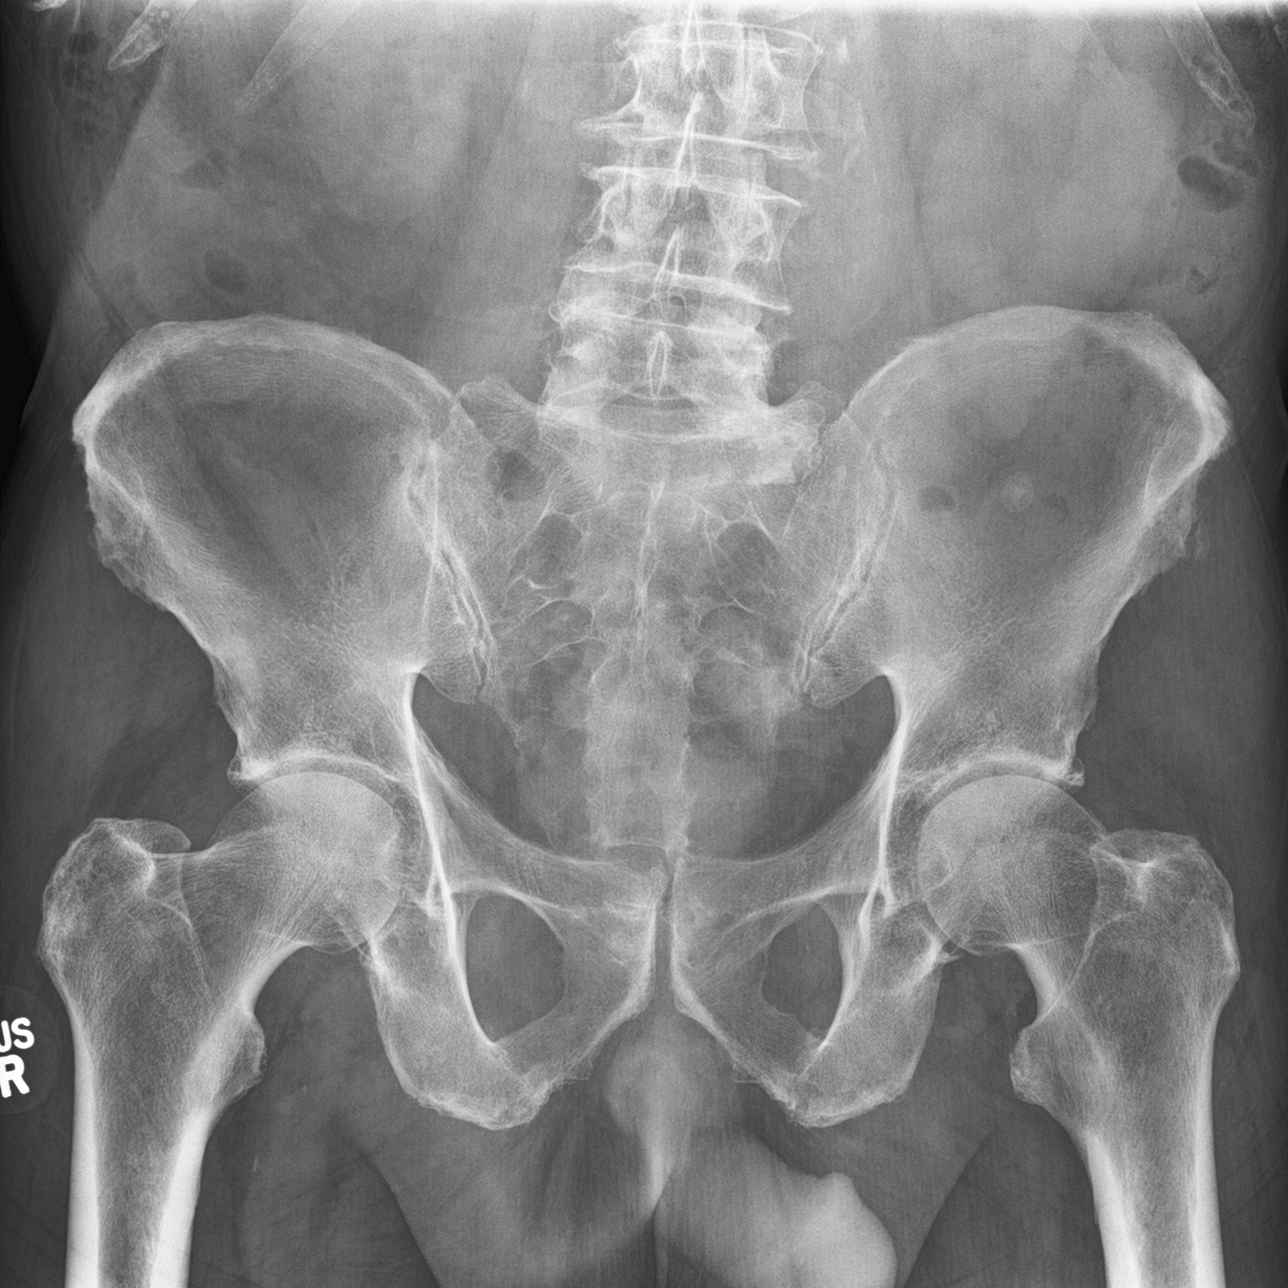
[im 2/3]
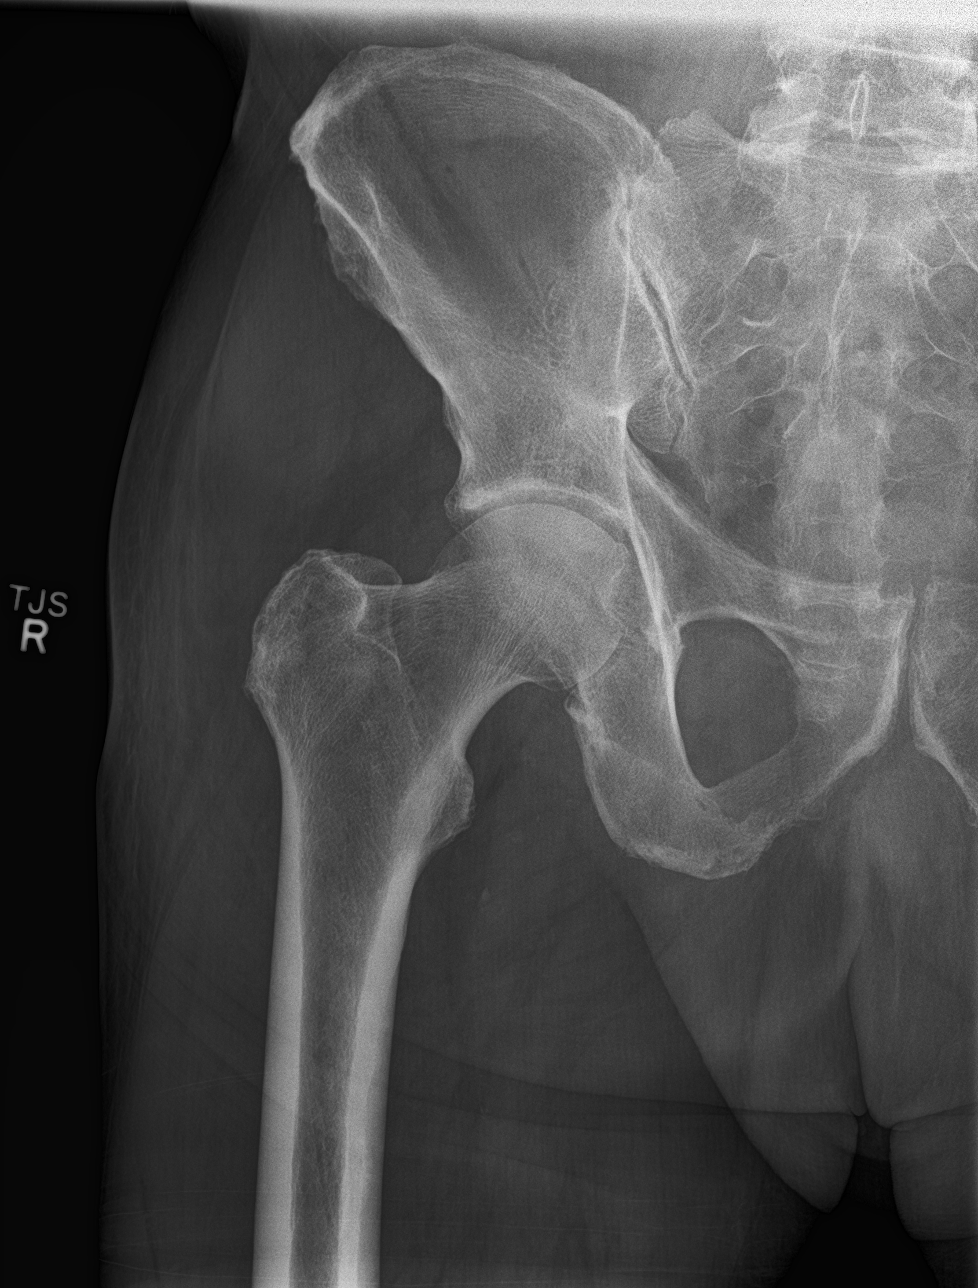
[im 3/3]
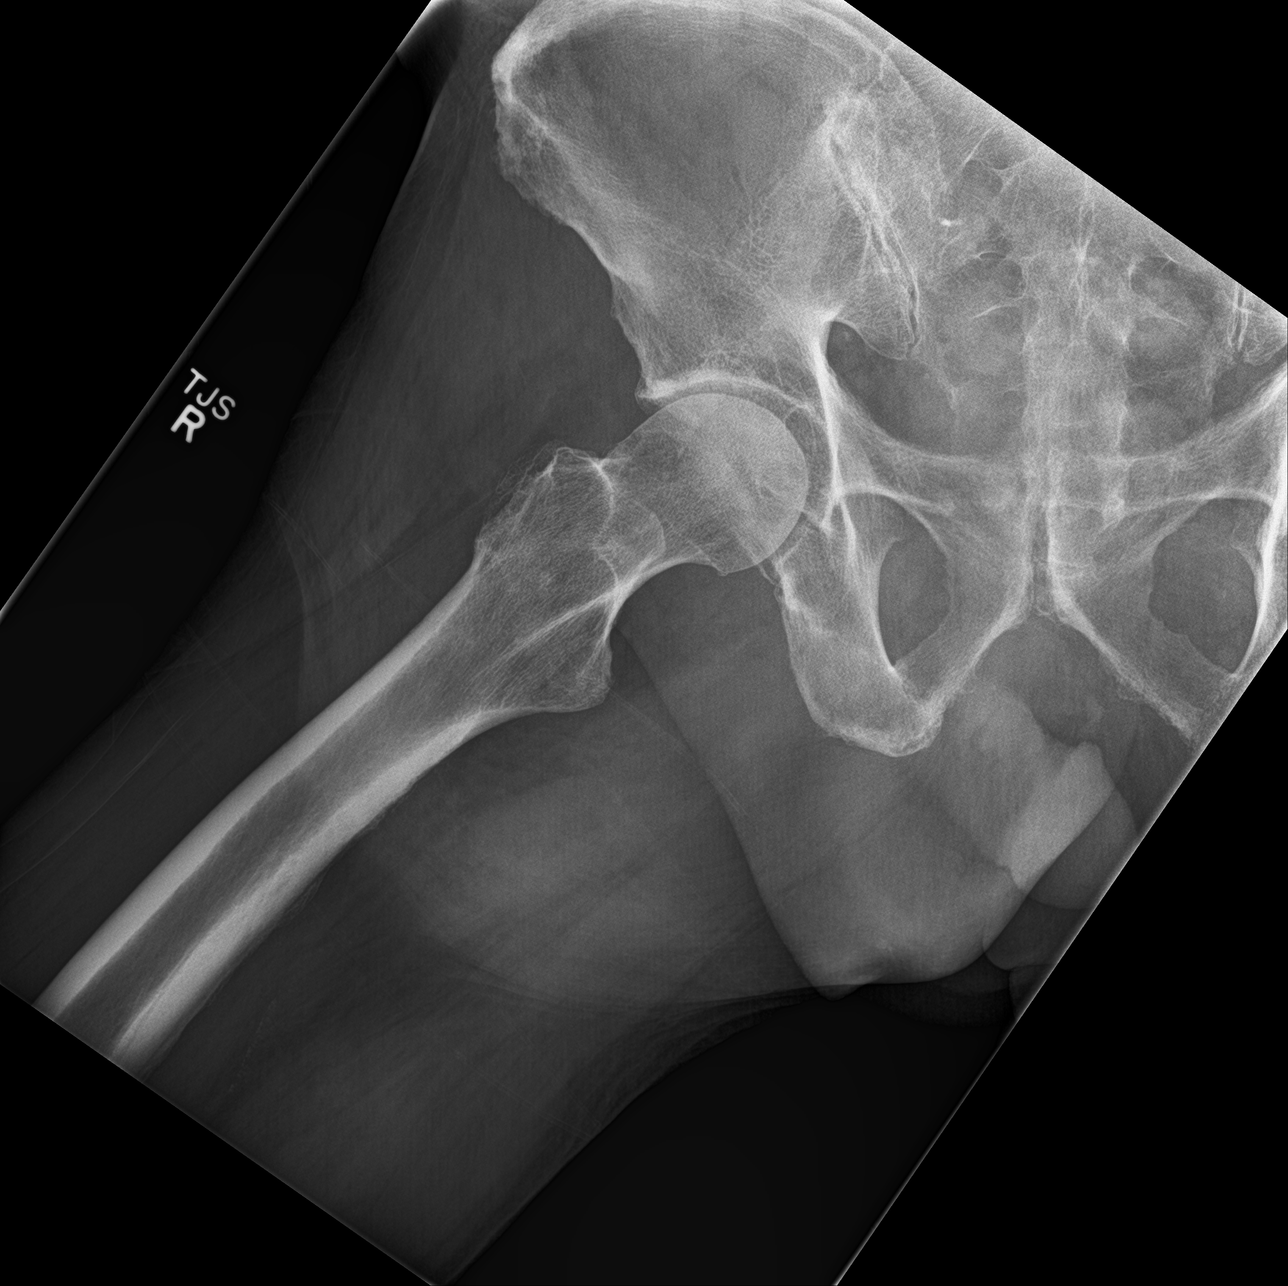

[3 of 3 positions shown; findings below may reference images not displayed]

FINDINGS: SI joint degenerative changes. Pubic symphysis and rami are intact.
No fracture or malalignment. Mild degenerative changes of the hips.
IMPRESSION: Mild degenerative changes.  No acute osseous abnormality.
